# Patient Record
Sex: Female | Born: 1960 | Race: White | Hispanic: No | Marital: Married | Smoking: Never smoker
Health system: Southern US, Community
[De-identification: ages and names within clinical notes are randomized; demographics above are authoritative.]

## PROBLEM LIST (undated history)

## (undated) DIAGNOSIS — Z860100 Personal history of colon polyps, unspecified: Secondary | ICD-10-CM

## (undated) DIAGNOSIS — I10 Essential (primary) hypertension: Secondary | ICD-10-CM

## (undated) DIAGNOSIS — E559 Vitamin D deficiency, unspecified: Secondary | ICD-10-CM

## (undated) DIAGNOSIS — J020 Streptococcal pharyngitis: Secondary | ICD-10-CM

## (undated) DIAGNOSIS — I499 Cardiac arrhythmia, unspecified: Secondary | ICD-10-CM

## (undated) DIAGNOSIS — I5189 Other ill-defined heart diseases: Secondary | ICD-10-CM

## (undated) DIAGNOSIS — J309 Allergic rhinitis, unspecified: Secondary | ICD-10-CM

## (undated) DIAGNOSIS — Z85820 Personal history of malignant melanoma of skin: Secondary | ICD-10-CM

## (undated) DIAGNOSIS — K5790 Diverticulosis of intestine, part unspecified, without perforation or abscess without bleeding: Secondary | ICD-10-CM

## (undated) HISTORY — DX: Essential (primary) hypertension: I10

## (undated) HISTORY — DX: Personal history of colonic polyps: Z86.010

## (undated) HISTORY — DX: Diverticulosis of intestine, part unspecified, without perforation or abscess without bleeding: K57.90

## (undated) HISTORY — DX: Streptococcal pharyngitis: J02.0

## (undated) HISTORY — DX: Personal history of colon polyps, unspecified: Z86.0100

## (undated) HISTORY — DX: Vitamin D deficiency, unspecified: E55.9

## (undated) HISTORY — DX: Cardiac arrhythmia, unspecified: I49.9

## (undated) HISTORY — DX: Personal history of malignant melanoma of skin: Z85.820

## (undated) HISTORY — DX: Other ill-defined heart diseases: I51.89

## (undated) HISTORY — PX: ABLATION: SHX5711

## (undated) HISTORY — DX: Allergic rhinitis, unspecified: J30.9

---

## 1985-12-06 HISTORY — PX: MANDIBLE SURGERY: SHX707

## 2000-04-05 HISTORY — PX: DERMOID CYST  EXCISION: SHX1452

## 2000-04-22 ENCOUNTER — Encounter (INDEPENDENT_AMBULATORY_CARE_PROVIDER_SITE_OTHER): Payer: Self-pay

## 2000-04-22 ENCOUNTER — Inpatient Hospital Stay (HOSPITAL_COMMUNITY): Admission: EM | Admit: 2000-04-22 | Discharge: 2000-04-22 | Payer: Self-pay | Admitting: *Deleted

## 2000-04-22 ENCOUNTER — Encounter: Payer: Self-pay | Admitting: *Deleted

## 2002-01-31 ENCOUNTER — Encounter: Admission: RE | Admit: 2002-01-31 | Discharge: 2002-01-31 | Payer: Self-pay | Admitting: Unknown Physician Specialty

## 2002-01-31 ENCOUNTER — Encounter: Payer: Self-pay | Admitting: Unknown Physician Specialty

## 2002-10-01 ENCOUNTER — Encounter: Payer: Self-pay | Admitting: Unknown Physician Specialty

## 2002-10-01 ENCOUNTER — Encounter: Admission: RE | Admit: 2002-10-01 | Discharge: 2002-10-01 | Payer: Self-pay | Admitting: Unknown Physician Specialty

## 2003-10-08 ENCOUNTER — Encounter: Admission: RE | Admit: 2003-10-08 | Discharge: 2003-10-08 | Payer: Self-pay | Admitting: Unknown Physician Specialty

## 2004-10-21 ENCOUNTER — Encounter: Admission: RE | Admit: 2004-10-21 | Discharge: 2004-10-21 | Payer: Self-pay | Admitting: Unknown Physician Specialty

## 2006-01-05 ENCOUNTER — Encounter: Admission: RE | Admit: 2006-01-05 | Discharge: 2006-01-05 | Payer: Self-pay | Admitting: Unknown Physician Specialty

## 2007-01-06 ENCOUNTER — Encounter: Admission: RE | Admit: 2007-01-06 | Discharge: 2007-01-06 | Payer: Self-pay | Admitting: Unknown Physician Specialty

## 2008-01-08 ENCOUNTER — Encounter: Admission: RE | Admit: 2008-01-08 | Discharge: 2008-01-08 | Payer: Self-pay | Admitting: Unknown Physician Specialty

## 2008-02-04 DIAGNOSIS — J020 Streptococcal pharyngitis: Secondary | ICD-10-CM

## 2008-02-04 HISTORY — DX: Streptococcal pharyngitis: J02.0

## 2008-03-06 HISTORY — PX: NEVUS EXCISION: SHX2090

## 2009-02-17 ENCOUNTER — Encounter: Admission: RE | Admit: 2009-02-17 | Discharge: 2009-02-17 | Payer: Self-pay | Admitting: Internal Medicine

## 2010-03-05 ENCOUNTER — Encounter: Admission: RE | Admit: 2010-03-05 | Discharge: 2010-03-05 | Payer: Self-pay | Admitting: Unknown Physician Specialty

## 2010-12-27 ENCOUNTER — Encounter: Payer: Self-pay | Admitting: Internal Medicine

## 2011-02-22 ENCOUNTER — Other Ambulatory Visit: Payer: Self-pay | Admitting: Internal Medicine

## 2011-02-22 DIAGNOSIS — Z1231 Encounter for screening mammogram for malignant neoplasm of breast: Secondary | ICD-10-CM

## 2011-03-08 ENCOUNTER — Ambulatory Visit: Payer: Self-pay

## 2011-03-16 ENCOUNTER — Ambulatory Visit
Admission: RE | Admit: 2011-03-16 | Discharge: 2011-03-16 | Disposition: A | Discharge status: Home / Self Care | Payer: BC Managed Care – PPO | Source: Ambulatory Visit | Attending: Internal Medicine | Admitting: Internal Medicine

## 2011-03-16 DIAGNOSIS — Z1231 Encounter for screening mammogram for malignant neoplasm of breast: Secondary | ICD-10-CM

## 2012-02-10 ENCOUNTER — Other Ambulatory Visit: Payer: Self-pay | Admitting: Respiratory Therapy

## 2012-02-10 ENCOUNTER — Other Ambulatory Visit: Payer: Self-pay | Admitting: Internal Medicine

## 2012-02-10 DIAGNOSIS — Z1231 Encounter for screening mammogram for malignant neoplasm of breast: Secondary | ICD-10-CM

## 2012-03-21 ENCOUNTER — Ambulatory Visit
Admission: RE | Admit: 2012-03-21 | Discharge: 2012-03-21 | Disposition: A | Discharge status: Home / Self Care | Payer: BC Managed Care – PPO | Source: Ambulatory Visit | Attending: Internal Medicine | Admitting: Internal Medicine

## 2012-03-21 DIAGNOSIS — Z1231 Encounter for screening mammogram for malignant neoplasm of breast: Secondary | ICD-10-CM

## 2012-04-13 HISTORY — PX: DILATION AND CURETTAGE, DIAGNOSTIC / THERAPEUTIC: SUR384

## 2013-03-06 ENCOUNTER — Other Ambulatory Visit: Payer: Self-pay

## 2013-03-06 DIAGNOSIS — Z1231 Encounter for screening mammogram for malignant neoplasm of breast: Secondary | ICD-10-CM

## 2013-04-03 ENCOUNTER — Ambulatory Visit: Payer: BC Managed Care – PPO

## 2013-04-06 ENCOUNTER — Ambulatory Visit
Admission: RE | Admit: 2013-04-06 | Discharge: 2013-04-06 | Disposition: A | Discharge status: Home / Self Care | Payer: BC Managed Care – PPO | Source: Ambulatory Visit

## 2013-04-06 DIAGNOSIS — Z1231 Encounter for screening mammogram for malignant neoplasm of breast: Secondary | ICD-10-CM

## 2013-09-05 ENCOUNTER — Ambulatory Visit (INDEPENDENT_AMBULATORY_CARE_PROVIDER_SITE_OTHER): Payer: BC Managed Care – PPO | Admitting: Psychology

## 2013-09-05 DIAGNOSIS — F4320 Adjustment disorder, unspecified: Secondary | ICD-10-CM

## 2013-09-06 ENCOUNTER — Ambulatory Visit (INDEPENDENT_AMBULATORY_CARE_PROVIDER_SITE_OTHER): Payer: BC Managed Care – PPO | Admitting: Psychology

## 2013-09-06 DIAGNOSIS — F4320 Adjustment disorder, unspecified: Secondary | ICD-10-CM

## 2013-09-14 ENCOUNTER — Ambulatory Visit (INDEPENDENT_AMBULATORY_CARE_PROVIDER_SITE_OTHER): Payer: BC Managed Care – PPO | Admitting: Psychology

## 2013-09-14 DIAGNOSIS — F4320 Adjustment disorder, unspecified: Secondary | ICD-10-CM

## 2013-09-21 ENCOUNTER — Ambulatory Visit (INDEPENDENT_AMBULATORY_CARE_PROVIDER_SITE_OTHER): Payer: BC Managed Care – PPO | Admitting: Psychology

## 2013-09-21 DIAGNOSIS — F4320 Adjustment disorder, unspecified: Secondary | ICD-10-CM

## 2013-09-24 ENCOUNTER — Ambulatory Visit (INDEPENDENT_AMBULATORY_CARE_PROVIDER_SITE_OTHER): Payer: BC Managed Care – PPO | Admitting: Psychology

## 2013-09-24 ENCOUNTER — Ambulatory Visit: Payer: BC Managed Care – PPO | Admitting: Psychology

## 2013-09-24 DIAGNOSIS — F4320 Adjustment disorder, unspecified: Secondary | ICD-10-CM

## 2013-10-05 ENCOUNTER — Ambulatory Visit: Payer: BC Managed Care – PPO | Admitting: Psychology

## 2014-03-27 ENCOUNTER — Other Ambulatory Visit: Payer: Self-pay

## 2014-03-27 DIAGNOSIS — Z1231 Encounter for screening mammogram for malignant neoplasm of breast: Secondary | ICD-10-CM

## 2014-04-19 ENCOUNTER — Encounter (INDEPENDENT_AMBULATORY_CARE_PROVIDER_SITE_OTHER): Payer: Self-pay

## 2014-04-19 ENCOUNTER — Ambulatory Visit
Admission: RE | Admit: 2014-04-19 | Discharge: 2014-04-19 | Disposition: A | Discharge status: Home / Self Care | Payer: BC Managed Care – PPO | Source: Ambulatory Visit

## 2014-04-19 DIAGNOSIS — Z1231 Encounter for screening mammogram for malignant neoplasm of breast: Secondary | ICD-10-CM

## 2016-04-06 ENCOUNTER — Other Ambulatory Visit: Payer: Self-pay

## 2016-08-14 ENCOUNTER — Encounter (HOSPITAL_COMMUNITY): Payer: Self-pay

## 2016-08-14 ENCOUNTER — Emergency Department (HOSPITAL_COMMUNITY)
Admission: EM | Admit: 2016-08-14 | Discharge: 2016-08-14 | Disposition: A | Discharge status: Home / Self Care | Payer: BLUE CROSS/BLUE SHIELD | Attending: Emergency Medicine | Admitting: Emergency Medicine

## 2016-08-14 DIAGNOSIS — F0781 Postconcussional syndrome: Secondary | ICD-10-CM | POA: Insufficient documentation

## 2016-08-14 DIAGNOSIS — Y9389 Activity, other specified: Secondary | ICD-10-CM | POA: Insufficient documentation

## 2016-08-14 DIAGNOSIS — S0990XA Unspecified injury of head, initial encounter: Secondary | ICD-10-CM | POA: Diagnosis present

## 2016-08-14 DIAGNOSIS — Z791 Long term (current) use of non-steroidal anti-inflammatories (NSAID): Secondary | ICD-10-CM | POA: Diagnosis not present

## 2016-08-14 DIAGNOSIS — R51 Headache: Secondary | ICD-10-CM | POA: Insufficient documentation

## 2016-08-14 DIAGNOSIS — Y999 Unspecified external cause status: Secondary | ICD-10-CM | POA: Diagnosis not present

## 2016-08-14 DIAGNOSIS — Z23 Encounter for immunization: Secondary | ICD-10-CM | POA: Diagnosis not present

## 2016-08-14 DIAGNOSIS — Y929 Unspecified place or not applicable: Secondary | ICD-10-CM | POA: Insufficient documentation

## 2016-08-14 MED ORDER — LIDOCAINE-EPINEPHRINE-TETRACAINE (LET) SOLUTION
3.0000 mL | Freq: Once | NASAL | Status: AC
  Administered 2016-08-14: 3 mL via TOPICAL
  Filled 2016-08-14: qty 3

## 2016-08-14 MED ORDER — ONDANSETRON HCL 4 MG/2ML IJ SOLN
4.0000 mg | Freq: Once | INTRAMUSCULAR | Status: AC
  Administered 2016-08-14: 4 mg via INTRAVENOUS
  Filled 2016-08-14: qty 2

## 2016-08-14 MED ORDER — METOCLOPRAMIDE HCL 5 MG/ML IJ SOLN
10.0000 mg | Freq: Once | INTRAMUSCULAR | Status: AC
  Administered 2016-08-14: 10 mg via INTRAVENOUS
  Filled 2016-08-14: qty 2

## 2016-08-14 MED ORDER — ACETAMINOPHEN 325 MG PO TABS
650.0000 mg | ORAL_TABLET | Freq: Once | ORAL | Status: AC
  Administered 2016-08-14: 650 mg via ORAL
  Filled 2016-08-14: qty 2

## 2016-08-14 MED ORDER — DIPHENHYDRAMINE HCL 50 MG/ML IJ SOLN
25.0000 mg | Freq: Once | INTRAMUSCULAR | Status: AC
  Administered 2016-08-14: 25 mg via INTRAVENOUS
  Filled 2016-08-14: qty 1

## 2016-08-14 MED ORDER — SODIUM CHLORIDE 0.9 % IV BOLUS (SEPSIS)
1000.0000 mL | Freq: Once | INTRAVENOUS | Status: AC
  Administered 2016-08-14: 1000 mL via INTRAVENOUS

## 2016-08-14 MED ORDER — TETANUS-DIPHTH-ACELL PERTUSSIS 5-2.5-18.5 LF-MCG/0.5 IM SUSP
0.5000 mL | Freq: Once | INTRAMUSCULAR | Status: AC
  Administered 2016-08-14: 0.5 mL via INTRAMUSCULAR
  Filled 2016-08-14: qty 0.5

## 2016-08-14 NOTE — ED Notes (Signed)
Previous nurse verbalized LET given to pt prior to IV start per pt request.
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
## 2016-08-14 NOTE — ED Notes (Addendum)
Upon rounding on pt, pt states "this is ridiculous. I have been here since eight thirty this morning. I am ready to go." Pt begins to pull own IV out verbalizes "just let me go." At this time, physician assistant walks in and states will print discharge paperwork.
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
## 2016-08-14 NOTE — ED Notes (Signed)
At time of discharge pt refused VS. Pt made aware of risks leaving with VS obtained. Pt continued to refuse and leave.

## 2016-08-14 NOTE — ED Notes (Signed)
Pt request zofran post administration of medications. See MAR.

## 2016-08-14 NOTE — Discharge Instructions (Signed)
Substance Abuse Treatment Programs ° °Intensive Outpatient Programs °[facility]     °[address]      °[place], [place]                   °[phone]      ° °[facility] °[address] °[place], [place] °[phone] ° °[facility]     °(Inpatient and outpatient)     °[address]           °[phone]   ° °[facility] °[phone] (Suboxone and Methadone) ° °[address]      °[place], [place] [address]      °[phone]      ° °[address] °[place], [place] °[phone] ° °[facility] (Outpatient/Inpatient, Chemical)    °(insurance only) [phone]      °       °[facility] (Groups & Residential) °[place], [place] °[phone] ° °   °[facility]     °[address]     °[place], [place]      °[phone]      ° °[facility] (for caregivers and family) °[address] °[place], [place] °336-299-4655 ° ° ° ° ° °Residential Treatment Programs °[facility]      °[address], [place], [place] [address]  °[phone]      ° °[facility] °[address] [place], [place] [address] °[phone] ° °[facility]        °[phone]      ° °[facility] °1-800-659-3381 ° °[facility]             °[address]                                         °[place], [place]                                                °877-615-2722 or 336-784-9470                              ° °[facility] [place] °[address] °[place], [address] °[phone] ° °[facility]    °[address]      °[place], [place]     °[phone]      ° °[facility] °[address] °[place], [place] °336-285-9073 ° °[facility]   °[address]     °[place], [place] [address]     °[phone]      °Admissions: 8am-3pm M-F ° °[facility] °[address] °[place],  [place] °336-227-7417 ° °[facility]: Residential Program (90 Days)   °[place], [place]      °336-725-8389 or 800-758-6077    ° °[facility] °[place],  °(Walk in Hours over the weekend or by referral) ° °[facility] °[address], [place],  [address] °([phone] ° °Crisis Mobile: [facility]:  1-877-626-1772 (for crisis response 24 hours a day) °[facility] Hotline:      1-800-256-2452 °Outpatient Psychiatry and Counseling ° °[facility]: Mobile Crisis   Management 24 hours:  1-877-626-1772 ° °[facility] sliding scale fee and walk in schedule: M-F 8am-12pm/1pm-3pm °[address]  °[place], [place] [address] °[phone] ° °[facility] °[address] °[place], [place] [address] °336-703-9650 ° °[facility] ([facility])- new patient walk-in appointments available Monday - Friday 8am -3pm.          °[address] °[place], [place] [address] °336-676-6840 or crisis line- 336-676-6905 ° °[facility] Intensive Outpatient Therapy Program °[address] °[place], [place] [address] °336-832-9804 ° °[facility]                  °Crisis Services      °[phone]      °[address]     °[place], [place] [address]                ° °[facility]   °[facility] °[phone] °[address] °[place], [place] [address] ° ° °[facility]          °[address]  °[place], [place] [address]       °[phone] ° °[facility] °[address] °[place], [place] [address] °336-292-1510 ° °[facility]    °[address]    °[place], [place] [address]     °[phone]      ° °Parish McKinney, MD     °[address]     °[place] [address]     °336-282-1251     °  °[facility] °[address] °[place] [address] ° °[facility]     °[address]     °[place], [place]      °336-542-2076      ° °[facility] °Shamsher Ahluwalia, MD °[address] °[place], [place] [address] °[phone] ° °[facility]     °[address]     °[place], [place] [address]     °336-274-1237      ° °[facility] °[address] °[place], [place] [address] °336-854-4450 °Resources for Temporary Residential Assistance/Crisis Centers ° °DAY CENTERS °[facility] °M-F 8am-3pm   °[address] [place], [place] [address]   336-332-0824 °Services include: laundry, barbering, support groups, case management, phone  & computer access, showers, AA/NA mtgs, mental health/substance abuse nurse, job skills class, disability information, VA assistance, spiritual classes, etc.  ° °HOMELESS SHELTERS ° °[facility]     °[facility]   °[address], [place]     °[phone]       °       °[facility] (women and children)       °[address] °[place], [place] [address] °336-275-0820 °[email or web address] for application and process °Application Required ° °[facility]   °[address]    °[place] [address]     °[phone]       °             °[facility] °[address] °[place], [place] [address] °336.273.5572 °336-235-0363(schedule application appt.) °Application Required ° °[facility] (women only)    °[address]     °[place],  [address]     °336-884-1039      °  Intake starts 6pm daily °Need valid ID, SSC, & Police report °[facility] [place] °[address] °[place], [place] °336-881-5420 °Application Required ° °[facility] (men only)     °[address]      °[place], [place]     °[phone]      ° °[facility] °(Pregnant women only) °[address] °[place], [place] °336-275-0206 ° °[facility]      °[address]      °[place], [place] [address]     °[phone]      °       °[facility] °[address] °[place], [place] °336-723-1848 °90 day commitment/SA/Application process ° °[facility](men only)     °[address]     °[place], [place]     °[phone]       °Check-in at 7pm     °       °[facility] °[address] °[place], [place] [address] °336-248-6684 °Men/Women/Women and Children must be there by 7 pm ° °[facility] °[place], [place] °336-722-8721                ° °

## 2016-10-05 ENCOUNTER — Telehealth: Payer: Self-pay | Admitting: Internal Medicine

## 2016-10-05 DIAGNOSIS — R079 Chest pain, unspecified: Secondary | ICD-10-CM

## 2016-10-05 DIAGNOSIS — I493 Ventricular premature depolarization: Secondary | ICD-10-CM

## 2016-10-05 DIAGNOSIS — I10 Essential (primary) hypertension: Secondary | ICD-10-CM

## 2016-10-05 NOTE — Addendum Note (Signed)
Addended by: Julio SicksBOWERS, JENNIFER L on: 10/05/2016 04:42 PM   Modules accepted: Orders

## 2016-10-05 NOTE — Telephone Encounter (Signed)
Pt left text earlier in day  BP was high at 179/101 Complained of CP and jaw pain  [facility]  Blood work done  Trop negative per her report.  BP caome down some though ramined high for her 140s/70s  PT says that she had some ventricular bigeminy and then PVCs on monitor.  She was [facility] with recomm to f/u with primary MD The pt has been under tremendous stress due to family problems   She went walking on Sunday a couple miles without problem She wroks as an anesthesiologist.    I called pt last night when at home  Feeling better   I examined and reviewed d/c note form [facility]  Took pt BP which was 120s/70s  P 70 with occasional skip.  Pt appeared comfortable but anxious   No CP  Lungs CTA  Card RRR  No murmurs    I recomm low dose b blocker  (metoprolol 12.5 bid) given increased BP trends and PVCs. Called [facility] to [facility] [address] Activity as tolerated  Based on response, consider further testing     PT  called back today  Feeling good  BP 116/  Sat 99 Still with Freq skips  Trigeminy at times  on monitor at work  [facility]  y[facility]  Trop negative  Sent home with recommendation  for outpt f/u for HTN in 1 week   CP feltt atypical     Plan:  Keep on low dose b blocker for now.   Activity as tolerated  Follow BP   WIll arrange for outpt echo.  Contact pt back for scheduling    Dietrich PatesPaula Larua Collier

## 2016-10-05 NOTE — Telephone Encounter (Signed)
Placed order for echo.  Spoke with Martie LeeSabrina at the [place] office and scheduled pt's echo for 10/11/16 at 4pm.  Tried to contact pt, phone rang out several times with no answer and no VM option.  Will try again later.

## 2016-10-06 ENCOUNTER — Other Ambulatory Visit: Payer: Self-pay | Admitting: *Deleted

## 2016-10-06 DIAGNOSIS — R002 Palpitations: Secondary | ICD-10-CM

## 2016-10-06 NOTE — Progress Notes (Signed)
Call from Dr. Tenny Crawoss who requests patient have 24 hr hol[facility] and [facility] for [facility].  Pt scheduled for echo on Monday there but per Sabrina in [place] scheduling unable to add holter and ekg at that time.   If patient is available 10/07/16 in the morning these can be done at that time.  Orders have been placed. When confirmed that patient is agreeable to tomorrow, I will call Sabrina back and schedule.  Spoke with scheduler in [place], pt can come at 7:30 am tomorrow for EKG, then holter.  Informed Dr. Tenny Crawoss who will inform the patient.

## 2016-10-06 NOTE — Telephone Encounter (Signed)
Left message to call back  

## 2016-10-07 NOTE — Progress Notes (Signed)
Patient missed pick up of monitor this AM  Had work change Will try to arrange alternate time  Can monitor be placed on day I am in office?

## 2016-10-07 NOTE — Progress Notes (Signed)
Patient to have echo at [facility] on Monday afternoon.

## 2016-10-11 ENCOUNTER — Ambulatory Visit (INDEPENDENT_AMBULATORY_CARE_PROVIDER_SITE_OTHER): Payer: BLUE CROSS/BLUE SHIELD

## 2016-10-11 ENCOUNTER — Other Ambulatory Visit: Payer: Self-pay

## 2016-10-11 DIAGNOSIS — I493 Ventricular premature depolarization: Secondary | ICD-10-CM | POA: Diagnosis not present

## 2016-10-11 DIAGNOSIS — I10 Essential (primary) hypertension: Secondary | ICD-10-CM

## 2016-10-11 DIAGNOSIS — R079 Chest pain, unspecified: Secondary | ICD-10-CM | POA: Diagnosis not present

## 2016-10-11 NOTE — Telephone Encounter (Signed)
Pt here in our office for ECHO. Pt states that she was told that she was to get a stress ECHO; she has been NPO in preparation. Pt is upset and is attempting to get in touch w/ "Dr. Ladona Ridgelaylor - I know Dr. Tenny Crawoss by her married name." Pt is in the waiting room attempting to text Dr. Tenny Crawoss. Pt was advised by ECHO tech that even if new order is placed, we do not have staff to do a stress ECHO this pm.  I have not spoken w/ pt, but ECHO tech states that pt is quite frustrated due to the confusion.

## 2016-10-18 ENCOUNTER — Ambulatory Visit (INDEPENDENT_AMBULATORY_CARE_PROVIDER_SITE_OTHER): Payer: BLUE CROSS/BLUE SHIELD

## 2016-10-18 DIAGNOSIS — R002 Palpitations: Secondary | ICD-10-CM | POA: Diagnosis not present

## 2016-12-04 ENCOUNTER — Other Ambulatory Visit: Payer: Self-pay | Admitting: Internal Medicine

## 2016-12-07 NOTE — Telephone Encounter (Signed)
Please advise on refill request. Requested medication is not listed on snapshot, also I do not see where patient has ever had an office visit here. Thanks, MI

## 2016-12-09 NOTE — Telephone Encounter (Signed)
Katie RifflePaula V Ross, MD      1:47 PM  Note    Pt left text earlier in day  BP was high at 179/101 Complained of CP and jaw pain  [facility]  Blood work done  Trop negative per her report.  BP caome down some though ramined high for her 140s/70s  PT says that she had some ventricular bigeminy and then PVCs on monitor.  [facility] with recomm to f/u with primary MD The pt has been under tremendous stress due to family problems   She went walking on Sunday a couple miles without problem She wroks as an anesthesiologist.    I called pt last night when at home  Feeling better   I examined and reviewed d/c note form [facility]  Took pt BP which was 120s/70s  P 70 with occasional skip.  Pt appeared comfortable but anxious   No CP  Lungs CTA  Card RRR  No murmurs    I recomm low dose b blocker  (metoprolol 12.5 bid) given increased BP trends and PVCs. [facility] [address] Activity as tolerated  Based on response, consider further testing     PT  called back today  Feeling good  BP 116/  Sat 99 Still with Freq skips  Trigeminy at times  on monitor at work  [facility]  Trop negative  Sent home with recommendation  for outpt f/u for HTN in 1 week   CP feltt atypical     Plan:  Keep on low dose b blocker for now.   Activity as tolerated  Follow BP   WIll arrange for outpt echo.  Contact pt back for scheduling        Copied note from encounter dated 10/05/16 Refilled metoprolol 12.5 mg BID

## 2017-12-05 ENCOUNTER — Encounter: Payer: Self-pay | Admitting: *Deleted

## 2018-01-02 ENCOUNTER — Ambulatory Visit: Payer: BLUE CROSS/BLUE SHIELD | Admitting: Internal Medicine

## 2018-01-16 ENCOUNTER — Ambulatory Visit: Payer: Self-pay | Admitting: Internal Medicine

## 2018-01-27 ENCOUNTER — Ambulatory Visit: Payer: Self-pay | Admitting: Internal Medicine

## 2018-01-30 ENCOUNTER — Encounter: Payer: Self-pay | Admitting: Internal Medicine

## 2018-01-30 ENCOUNTER — Ambulatory Visit: Payer: Managed Care, Other (non HMO) | Admitting: Internal Medicine

## 2018-01-30 VITALS — BP 114/80 | HR 59 | Ht 67.0 in | Wt 126.2 lb

## 2018-01-30 DIAGNOSIS — R059 Cough, unspecified: Secondary | ICD-10-CM

## 2018-01-30 DIAGNOSIS — R002 Palpitations: Secondary | ICD-10-CM

## 2018-01-30 DIAGNOSIS — I10 Essential (primary) hypertension: Secondary | ICD-10-CM | POA: Diagnosis not present

## 2018-01-30 DIAGNOSIS — R05 Cough: Secondary | ICD-10-CM

## 2018-01-30 LAB — LIPID PANEL
Chol/HDL Ratio: 2.3 ratio (ref 0.0–4.4)
Cholesterol, Total: 180 mg/dL (ref 100–199)
HDL: 78 mg/dL (ref >39)
LDL Calculated: 91 mg/dL (ref 0–99)
TRIGLYCERIDES: 57 mg/dL (ref 0–149)
VLDL CHOLESTEROL CAL: 11 mg/dL (ref 5–40)

## 2018-01-30 LAB — CBC
HEMATOCRIT: 37.1 % (ref 34.0–46.6)
Hemoglobin: 12.6 g/dL (ref 11.1–15.9)
MCH: 31.9 pg (ref 26.6–33.0)
MCHC: 34 g/dL (ref 31.5–35.7)
MCV: 94 fL (ref 79–97)
PLATELETS: 227 10*3/uL (ref 150–379)
RBC: 3.95 x10E6/uL (ref 3.77–5.28)
RDW: 13.9 % (ref 12.3–15.4)
WBC: 4.5 10*3/uL (ref 3.4–10.8)

## 2018-01-30 NOTE — Patient Instructions (Signed)
Your physician recommends that you continue on your current medications as directed. Please refer to the Current Medication list given to you today. Your physician recommends that you return for lab work in: TODAY (CBC, LIPIDS) A chest x-ray takes a picture of the organs and structures inside the chest, including the heart, lungs, and blood vessels. This test can show several things, including, whether the heart is enlarges; whether fluid is building up in the lungs; and whether pacemaker / defibrillator leads are still in place. Your physician wants you to follow-up in: 1 YEAR WITH DR. Tenny CrawOSS.  You will receive a reminder letter in the mail two months in advance. If you don't receive a letter, please call our office to schedule the follow-up appointment.

## 2018-01-30 NOTE — Progress Notes (Signed)
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 acetaminophen (TYLENOL) 325 MG tablet Take 650 mg by mouth daily as needed for moderate pain.  Marland Kitchen. ibuprofen (ADVIL,MOTRIN) 200 MG tablet Take 600 mg by mouth daily as needed for moderate pain.  . metoprolol tartrate (LOPRESSOR) 25 MG tablet Take 0.5 tablets (12.5 mg total) by mouth 2 (two) times daily.     Allergies:   Penicillins; Amoxicillin; Chocolate; and Other   Past Medical History:  Diagnosis Date  . Diastolic dysfunction without heart failure   . Diverticulosis   . High blood pressure   . History of malignant melanoma   . Irregular heartbeat   . Personal history of colonic polyps   . Rhinitis, allergic   . Strep throat 02/2008  . Vitamin D deficiency     Past Surgical History:  Procedure Laterality Date  . ABLATION    . DERMOID CYST  EXCISION Right 04/2000   right ovarian cyst  . DILATION AND CURETTAGE, DIAGNOSTIC / THERAPEUTIC  04/13/2012  . MANDIBLE SURGERY  12/1985   [facility], [place]  . NEVUS EXCISION  03/2008     Social History:  The patient  reports that  has never smoked. she has never used smokeless tobacco. She reports that she drinks alcohol. She reports that she does not use drugs.   Family History:  The patient's family history includes Breast cancer in her mother; Colon polyps in her mother;  Diabetes in her father; Healthy in her daughter; Hypertension in her father and mother; Melanoma in her brother; Obesity in her mother.    ROS:  Please see the history of present illness. All other systems are reviewed and  Negative to the above problem except as noted.    PHYSICAL EXAM: VS:  BP 114/80   Pulse (!) 59   Ht 5\' 7"  (1.702 m)   Wt 126 lb 3.2 oz (57.2 kg)   BMI 19.77 kg/m   GEN: Well nourished, well developed, in no acute distress  HEENT: normal  Neck: no JVD, carotid bruits, or masses Cardiac: RRR; no murmurs, rubs, or gallops,no edema  Respiratory:  Very mild upper airway wheeze  Rhonchi in L Lung    GI: soft, nontender, nondistended, + BS  No hepatomegaly  MS: no deformity Moving all extremities   Skin: warm and dry, no rash Neuro:  Strength and sensation are intact Psych: euthymic mood, full affect   EKG:  EKG is ordered today.  SB 59 bpm     Lipid Panel No results found for: CHOL, TRIG, HDL, CHOLHDL, VLDL, LDLCALC, LDLDIRECT    Wt Readings from Last 3 Encounters:  01/30/18 126 lb 3.2 oz (  57.2 kg)      ASSESSMENT AND PLAN:  1  HTN  Good control  Continue    2  Hx palpitations   Pt denies   Doing good  Keep on metoprolo  3  Pulm  Will set up for CXR   Encouraged use of Mucinex    4  HCM  Check CBC and lipids   Tentative f/u in 1 year     Current medicines are reviewed at length with the patient today.  The patient does not have concerns regarding medicines.  Signed, Dietrich Pates, MD  01/30/2018 10:41 AM    [facility] [address] [place], [place], [place]  [address] Phone: [phone]; Fax: [phone]

## 2018-01-31 ENCOUNTER — Ambulatory Visit
Admission: RE | Admit: 2018-01-31 | Discharge: 2018-01-31 | Disposition: A | Discharge status: Home / Self Care | Payer: Managed Care, Other (non HMO) | Source: Ambulatory Visit | Attending: Internal Medicine | Admitting: Internal Medicine

## 2018-01-31 DIAGNOSIS — R05 Cough: Secondary | ICD-10-CM

## 2018-01-31 DIAGNOSIS — R059 Cough, unspecified: Secondary | ICD-10-CM

## 2019-04-27 ENCOUNTER — Telehealth: Payer: Self-pay | Admitting: Internal Medicine

## 2019-04-27 NOTE — Telephone Encounter (Signed)
Virtual Visit Pre-Appointment Phone Call  "(Name), I am calling you today to discuss your upcoming appointment. We are currently trying to limit exposure to the virus that causes COVID-19 by seeing patients at home rather than in the office."  1. "What is the BEST phone number to call the day of the visit?" - include this in appointment notes  2. Do you have or have access to (through a family member/friend) a smartphone with video capability that we can use for your visit?" a. If yes - list this number in appt notes as cell (if different from BEST phone #) and list the appointment type as a VIDEO visit in appointment notes b. If no - list the appointment type as a PHONE visit in appointment notes  3. Confirm consent - "In the setting of the current Covid19 crisis, you are scheduled for a (phone or video) visit with your provider on (date) at (time).  Just as we do with many in-office visits, in order for you to participate in this visit, we must obtain consent.  If you'd like, I can send this to your mychart (if signed up) or email for you to review.  Otherwise, I can obtain your verbal consent now.  All virtual visits are billed to your insurance company just like a normal visit would be.  By agreeing to a virtual visit, we'd like you to understand that the technology does not allow for your provider to perform an examination, and thus may limit your provider's ability to fully assess your condition. If your provider identifies any concerns that need to be evaluated in person, we will make arrangements to do so.  Finally, though the technology is pretty good, we cannot assure that it will always work on either your or our end, and in the setting of a video visit, we may have to convert it to a phone-only visit.  In either situation, we cannot ensure that we have a secure connection.  Are you willing to proceed?" YES  4. Advise patient to be prepared - "Two hours prior to your appointment, go  ahead and check your blood pressure, pulse, oxygen saturation, and your weight (if you have the equipment to check those) and write them all down. When your visit starts, your provider will ask you for this information. If you have an Apple Watch or Kardia device, please plan to have heart rate information ready on the day of your appointment. Please have a pen and paper handy nearby the day of the visit as well."  5. Give patient instructions for MyChart download to smartphone OR Doximity/Doxy.me as below if video visit (depending on what platform provider is using)  6. Inform patient they will receive a phone call 15 minutes prior to their appointment time (may be from unknown caller ID) so they should be prepared to answer    TELEPHONE CALL NOTE  ANNETRA BEAGLEY has been deemed a candidate for a follow-up tele-health visit to limit community exposure during the Covid-19 pandemic. I spoke with the patient via phone to ensure availability of phone/video source, confirm preferred email & phone number, and discuss instructions and expectations.  I reminded Katie Joseph to be prepared with any vital sign and/or heart rhythm information that could potentially be obtained via home monitoring, at the time of her visit. I reminded NINO KIRSCHNER to expect a phone call prior to her visit.  Rayburn Ma Johnson 04/27/2019 1:25 PM   INSTRUCTIONS FOR DOWNLOADING  THE MYCHART APP TO SMARTPHONE  - The patient must first make sure to have activated MyChart and know their login information - If Apple, go to [employer] and type in MyChart in the search bar and download the app. If Android, ask patient to go to [employer] and type in [place] in the search bar and download the app. The app is free but as with any other app downloads, their phone may require them to verify saved payment information or Apple/Android password.  - The patient will need to then log into the app with their MyChart username and  password, and select [facility] as their healthcare provider to link the account. When it is time for your visit, go to the MyChart app, find appointments, and click Begin Video Visit. Be sure to Select Allow for your device to access the Microphone and Camera for your visit. You will then be connected, and your provider will be with you shortly.  **If they have any issues connecting, or need assistance please contact MyChart service desk [phone])**  **If using a computer, in order to ensure the best quality for their visit they will need to use either of the following Internet Browsers: [employer], or Google Chrome**  IF USING DOXIMITY or DOXY.ME - The patient will receive a link just prior to their visit by text.     FULL LENGTH CONSENT FOR TELE-HEALTH VISIT   I hereby voluntarily request, consent and authorize [facility] and its employed or contracted physicians, physician assistants, nurse practitioners or other licensed health care professionals (the Practitioner), to provide me with telemedicine health care services (the Services") as deemed necessary by the treating Practitioner. I acknowledge and consent to receive the Services by the Practitioner via telemedicine. I understand that the telemedicine visit will involve communicating with the Practitioner through live audiovisual communication technology and the disclosure of certain medical information by electronic transmission. I acknowledge that I have been given the opportunity to request an in-person assessment or other available alternative prior to the telemedicine visit and am voluntarily participating in the telemedicine visit.  I understand that I have the right to withhold or withdraw my consent to the use of telemedicine in the course of my care at any time, without affecting my right to future care or treatment, and that the Practitioner or I may terminate the telemedicine visit at any time. I  understand that I have the right to inspect all information obtained and/or recorded in the course of the telemedicine visit and may receive copies of available information for a reasonable fee.  I understand that some of the potential risks of receiving the Services via telemedicine include:   Delay or interruption in medical evaluation due to technological equipment failure or disruption;  Information transmitted may not be sufficient (e.g. poor resolution of images) to allow for appropriate medical decision making by the Practitioner; and/or   In rare instances, security protocols could fail, causing a breach of personal health information.  Furthermore, I acknowledge that it is my responsibility to provide information about my medical history, conditions and care that is complete and accurate to the best of my ability. I acknowledge that Practitioner's advice, recommendations, and/or decision may be based on factors not within their control, such as incomplete or inaccurate data provided by me or distortions of diagnostic images or specimens that may result from electronic transmissions. I understand that the practice of medicine is not an exact science and that Practitioner  makes no warranties or guarantees regarding treatment outcomes. I acknowledge that I will receive a copy of this consent concurrently upon execution via email to the email address I last provided but may also request a printed copy by calling the office of [facility].    I understand that my insurance will be billed for this visit.   I have read or had this consent read to me.  I understand the contents of this consent, which adequately explains the benefits and risks of the Services being provided via telemedicine.   I have been provided ample opportunity to ask questions regarding this consent and the Services and have had my questions answered to my satisfaction.  I give my informed consent for the services to be  provided through the use of telemedicine in my medical care  By participating in this telemedicine visit I agree to the above.

## 2019-05-06 NOTE — Progress Notes (Signed)
Virtual Visit via Video Note   This visit type was conducted due to national recommendations for restrictions regarding the COVID-19 Pandemic (e.g. social distancing) in an effort to limit this patient's exposure and mitigate transmission in our community.  Due to her co-morbid illnesses, this patient is at least at moderate risk for complications without adequate follow up.  This format is felt to be most appropriate for this patient at this time.  All issues noted in this document were discussed and addressed.  A limited physical exam was performed with this format.  Please refer to the patient's chart for her consent to telehealth for [facility]   Date:  05/07/2019   ID:  Katie Joseph, DOB October 23, 1961, MRN [id number]  Patient Location: Home Provider Location: Home  PCP:  Marden Noble, MD  Cardiologist:  Tenny Craw  Evaluation Performed:  Follow-Up Visit  Chief Complaint:  F/U of HTN and palpitaitons   History of Present Illness:    Katie Joseph is a 58 y.o. female with Hx of palpitations and HTN   I saw her in Feb 2019   Holter showed no signif arrhythmia, occasional PVCs   Treated with metoprolol  Since seen she has done well   She denies palpitations   Breathing is good   No CP    Has not taken BP recently   Very active working   [employer] at least 2 miles per day   The patient does not have symptoms concerning for COVID-19 infection (fever, chills, cough, or new shortness of breath).    Past Medical History:  Diagnosis Date  . Diastolic dysfunction without heart failure   . Diverticulosis   . High blood pressure   . History of malignant melanoma   . Irregular heartbeat   . Personal history of colonic polyps   . Rhinitis, allergic   . Strep throat 02/2008  . Vitamin D deficiency    Past Surgical History:  Procedure Laterality Date  . ABLATION    . DERMOID CYST  EXCISION Right 04/2000   right ovarian cyst  . DILATION AND CURETTAGE, DIAGNOSTIC / THERAPEUTIC  04/13/2012   . MANDIBLE SURGERY  12/1985   [facility], [place], [place]  . NEVUS EXCISION  03/2008     No outpatient medications have been marked as taking for the 05/07/19 encounter (Appointment) with Pricilla Riffle, MD.     Allergies:   Penicillins; Amoxicillin; Chocolate; and Other   Social History   Tobacco Use  . Smoking status: Never Smoker  . Smokeless tobacco: Never Used  Substance Use Topics  . Alcohol use: Yes    Comment: social  . Drug use: No     Family Hx: The patient's family history includes Breast cancer in her mother; Colon polyps in her mother; Diabetes in her father; Healthy in her daughter; Hypertension in her father and mother; Melanoma in her brother; Obesity in her mother. There is no history of Colon cancer or Liver cancer.  ROS:   Please see the history of present illness.     All other systems reviewed and are negative.   Prior CV studies:   The following studies were reviewed today:    Labs/Other Tests and Data Reviewed:    EKG:  No ECG reviewed.  Recent Labs: No results found for requested labs within last 8760 hours.   Recent Lipid Panel Lab Results  Component Value Date/Time   CHOL 180 01/30/2018 11:11 AM   TRIG 57 01/30/2018 11:11  AM   HDL 78 01/30/2018 11:11 AM   CHOLHDL 2.3 01/30/2018 11:11 AM   LDLCALC 91 01/30/2018 11:11 AM    Wt Readings from Last 3 Encounters:  01/30/18 126 lb 3.2 oz (57.2 kg)     Objective:    Vital Signs:  There were no vitals taken for this visit.   VITAL SIGNS:  reviewed  ASSESSMENT & PLAN:    1. Palpitations   Pt doing well  No palpitations     2   HTN   Asked her to track BP   She will keep on same meds   [facility]eds [facility]ills [facility]    COVID-19 Education: The signs and symptoms of COVID-19 were discussed with the patient and how to seek care for testing (follow up with PCP or arrange E-visit).  The importance of social distancing was discussed today.  Time:   Today, I have spent 15  minutes  with the patient with telehealth technology discussing the above problems.     Medication Adjustments/Labs and Tests Ordered: Current medicines are reviewed at length with the patient today.  Concerns regarding medicines are outlined above.   Tests Ordered: No orders of the defined types were placed in this encounter.   Medication Changes: No orders of the defined types were placed in this encounter.   Disposition:  Follow up next March 2021 Signed, Dietrich PatesPaula Delecia Vastine, MD  05/07/2019 9:03 AM    [facility]

## 2019-05-07 ENCOUNTER — Other Ambulatory Visit: Payer: Self-pay

## 2019-05-07 ENCOUNTER — Telehealth (INDEPENDENT_AMBULATORY_CARE_PROVIDER_SITE_OTHER): Payer: Managed Care, Other (non HMO) | Admitting: Internal Medicine

## 2019-05-07 DIAGNOSIS — I10 Essential (primary) hypertension: Secondary | ICD-10-CM | POA: Diagnosis not present

## 2019-05-07 DIAGNOSIS — R002 Palpitations: Secondary | ICD-10-CM | POA: Diagnosis not present

## 2019-05-07 MED ORDER — METOPROLOL TARTRATE 25 MG PO TABS: 12.5000 mg | ORAL_TABLET | Freq: Two times a day (BID) | ORAL | 3 refills | Status: DC

## 2019-05-07 NOTE — Patient Instructions (Addendum)
Medication Instructions:  No changes If you need a refill on your cardiac medications before your next appointment, please call your pharmacy.   Lab work: none If you have labs (blood work) drawn today and your tests are completely normal, you will receive your results only by: Marland Kitchen MyChart Message (if you have MyChart) OR . A paper copy in the mail If you have any lab test that is abnormal or we need to change your treatment, we will call you to review the results.  Testing/Procedures: none  Follow-Up: At [facility], you and your health needs are our priority.  As part of our continuing mission to provide you with exceptional heart care, we have created designated Provider Care Teams.  These Care Teams include your primary Cardiologist (physician) and Advanced Practice Providers (APPs -  Physician Assistants and Nurse Practitioners) who all work together to provide you with the care you need, when you need it. You will need a follow up appointment in:  March, 2021 --9 months.  Please call our office 2 months in advance to schedule this appointment.  You may see Dr. Tenny Craw or one of the following Advanced Practice Providers on your designated Care Team: Tereso Newcomer, PA-C [place], [place] . Berton Bon, NP  Any Other Special Instructions Will Be Listed Below (If Applicable).

## 2019-07-09 ENCOUNTER — Telehealth: Payer: Self-pay | Admitting: Internal Medicine

## 2019-07-09 MED ORDER — METOPROLOL TARTRATE 25 MG PO TABS: ORAL_TABLET | ORAL | 3 refills | Status: DC

## 2019-07-09 NOTE — Addendum Note (Signed)
Addended by: Stephani Police on: 07/09/2019 08:37 AM   Modules accepted: Orders

## 2019-07-09 NOTE — Telephone Encounter (Signed)
RX sent for Metoprolol per Dr. Harrington Challenger orders.

## 2019-07-09 NOTE — Telephone Encounter (Signed)
Patient needs refills om metoprolol tartrate   Fill as 25 mg tid as she sometimes has to take extra for palpitations   Fill 3 months  [facility]

## 2020-01-28 DIAGNOSIS — Z021 Encounter for pre-employment examination: Secondary | ICD-10-CM | POA: Diagnosis not present

## 2020-01-28 DIAGNOSIS — I519 Heart disease, unspecified: Secondary | ICD-10-CM | POA: Diagnosis not present

## 2020-01-28 DIAGNOSIS — I499 Cardiac arrhythmia, unspecified: Secondary | ICD-10-CM | POA: Diagnosis not present

## 2020-01-28 DIAGNOSIS — Z0184 Encounter for antibody response examination: Secondary | ICD-10-CM | POA: Diagnosis not present

## 2020-01-28 DIAGNOSIS — I10 Essential (primary) hypertension: Secondary | ICD-10-CM | POA: Diagnosis not present

## 2020-01-30 DIAGNOSIS — Z23 Encounter for immunization: Secondary | ICD-10-CM | POA: Diagnosis not present

## 2020-03-28 ENCOUNTER — Telehealth: Payer: Self-pay | Admitting: Internal Medicine

## 2020-03-28 MED ORDER — METOPROLOL TARTRATE 25 MG PO TABS: ORAL_TABLET | ORAL | 3 refills | Status: DC

## 2020-03-28 NOTE — Addendum Note (Signed)
Addended by: Lendon Ka on: 03/28/2020 05:00 PM   Modules accepted: Orders

## 2020-03-28 NOTE — Telephone Encounter (Signed)
Pt needs refill on b blocker

## 2020-03-31 DIAGNOSIS — Z1231 Encounter for screening mammogram for malignant neoplasm of breast: Secondary | ICD-10-CM | POA: Diagnosis not present

## 2020-03-31 DIAGNOSIS — Z01419 Encounter for gynecological examination (general) (routine) without abnormal findings: Secondary | ICD-10-CM | POA: Diagnosis not present

## 2020-05-19 ENCOUNTER — Telehealth: Payer: Self-pay | Admitting: *Deleted

## 2020-05-19 NOTE — Telephone Encounter (Signed)
Message from Dr. Tenny Craw that patient needs refills on medications.  Had office visit w PCP and labs recently.  Dr. Tenny Craw is requesting notes from ov and labs to review before refilling medications.   I called Dr. Kevan Ny office and left VM on medical records to fax information to [phone].

## 2020-11-06 DIAGNOSIS — E559 Vitamin D deficiency, unspecified: Secondary | ICD-10-CM | POA: Diagnosis not present

## 2020-11-06 DIAGNOSIS — I10 Essential (primary) hypertension: Secondary | ICD-10-CM | POA: Diagnosis not present

## 2020-11-06 DIAGNOSIS — Z13220 Encounter for screening for lipoid disorders: Secondary | ICD-10-CM | POA: Diagnosis not present

## 2020-11-06 DIAGNOSIS — Z0000 Encounter for general adult medical examination without abnormal findings: Secondary | ICD-10-CM | POA: Diagnosis not present

## 2020-11-10 ENCOUNTER — Other Ambulatory Visit: Payer: Self-pay

## 2020-11-10 ENCOUNTER — Ambulatory Visit (INDEPENDENT_AMBULATORY_CARE_PROVIDER_SITE_OTHER): Payer: BLUE CROSS/BLUE SHIELD | Admitting: Internal Medicine

## 2020-11-10 ENCOUNTER — Encounter: Payer: Self-pay | Admitting: Internal Medicine

## 2020-11-10 VITALS — BP 110/70 | HR 68 | Ht 67.0 in | Wt 125.6 lb

## 2020-11-10 DIAGNOSIS — R002 Palpitations: Secondary | ICD-10-CM

## 2020-11-10 DIAGNOSIS — I10 Essential (primary) hypertension: Secondary | ICD-10-CM | POA: Diagnosis not present

## 2020-11-10 NOTE — Progress Notes (Signed)
Cardiology Office Note   Date:  11/10/2020   ID:  Katie Joseph, DOB 1961/04/02, MRN [id number]  PCP:  Marden Noble, MD  Cardiologist:   Dietrich Pates, MD   [name] is a 59 yo who presents for f/u of HTN   History of Present Illness: Katie Joseph is a 59 y.o. female with a history of palpitations and HTN   I saw her in 2017   She had an echo done that was normal   Holter monitor showed no signif arrhythmias, occasional PVCs  The pt has done well on metoprolol  I last saw her in clinic in 2019  Since seen she has done well  Breathing is OK   She denies CP   She is very active, walking over 3 miles per day.    She is concerned because recent lipids showed: 2020 values:Total chol 213   HDL 87  LDL 119  Trig 40 2021:  Total 225   HDL 83  LDL 131  Trig         Current Meds  Medication Sig  . acetaminophen (TYLENOL) 325 MG tablet Take 650 mg by mouth daily as needed for moderate pain.  Marland Kitchen ibuprofen (ADVIL,MOTRIN) 200 MG tablet Take 600 mg by mouth daily as needed for moderate pain.  . metoprolol tartrate (LOPRESSOR) 25 MG tablet Take 1 tablet (25mg ) twice a day and an extra one tablet (25mg ) as needed for palpitations.     Allergies:   Penicillins, Amoxicillin, Chocolate, and Other   Past Medical History:  Diagnosis Date  . Diastolic dysfunction without heart failure   . Diverticulosis   . High blood pressure   . History of malignant melanoma   . Irregular heartbeat   . Personal history of colonic polyps   . Rhinitis, allergic   . Strep throat 02/2008  . Vitamin D deficiency     Past Surgical History:  Procedure Laterality Date  . ABLATION    . DERMOID CYST  EXCISION Right 04/2000   right ovarian cyst  . DILATION AND CURETTAGE, DIAGNOSTIC / THERAPEUTIC  04/13/2012  . MANDIBLE SURGERY  12/1985   [facility], [place], [facility]  . NEVUS EXCISION  03/2008     Social History:  The patient  reports that she has never smoked. She has never used smokeless tobacco.  She reports current alcohol use. She reports that she does not use drugs.   Family History:  The patient's family history includes Breast cancer in her mother; Colon polyps in her mother; Diabetes in her father; Healthy in her daughter; Hypertension in her father and mother; Melanoma in her brother; Obesity in her mother.    ROS:  Please see the history of present illness. All other systems are reviewed and  Negative to the above problem except as noted.    PHYSICAL EXAM: VS:  BP 110/70   Pulse 68   Ht 5\' 7"  (1.702 m)   Wt 125 lb 9.6 oz (57 kg)   SpO2 98%   BMI 19.67 kg/m   GEN: Well nourished, well developed, in no acute distress  HEENT: normal  Neck: no JVD, carotid bruits Cardiac: RRR; no murmurs.  No LE edema  Respiratory:  CTA GI: soft, nontender, nondistended, + BS  No hepatomegaly  MS: no deformity Moving all extremities   Skin: warm and dry, no rash Neuro:  Strength and sensation are intact Psych: euthymic mood, full affect   EKG:  EKG is ordered today.  SR 68 bpm   Nonspecific ST changes   Lipid Panel    Component Value Date/Time   CHOL 180 01/30/2018 1111   TRIG 57 01/30/2018 1111   HDL 78 01/30/2018 1111   CHOLHDL 2.3 01/30/2018 1111   LDLCALC 91 01/30/2018 1111      Wt Readings from Last 3 Encounters:  11/10/20 125 lb 9.6 oz (57 kg)  01/30/18 126 lb 3.2 oz (57.2 kg)      ASSESSMENT AND PLAN:  1  HTN  Good control  Continue    2  Hx palpitations   Pt denies Keep on meds  3  Lipids   Lipids arent too bad   Goal will depend on if she has evid of plaquing   Will set up for Ca score CT   Allso check lipomed panel to further risk stratify       Tentative f/u in 1 year     Current medicines are reviewed at length with the patient today.  The patient does not have concerns regarding medicines.  Signed, Dietrich Pates, MD  11/10/2020 2:55 PM    [facility] [address] [place], [place], [place]  [address] Phone: [phone]; Fax:  [phone]

## 2020-11-10 NOTE — Patient Instructions (Signed)
Medication Instructions:  No changes *If you need a refill on your cardiac medications before your next appointment, please call your pharmacy*   Lab Work: Please return for fasting NMR profile  If you have labs (blood work) drawn today and your tests are completely normal, you will receive your results only by: Marland Kitchen MyChart Message (if you have MyChart) OR . A paper copy in the mail If you have any lab test that is abnormal or we need to change your treatment, we will call you to review the results.   Testing/Procedures: Calcium score ct scan   Follow-Up:  Other Instructions

## 2020-11-18 ENCOUNTER — Other Ambulatory Visit: Payer: BLUE CROSS/BLUE SHIELD

## 2020-11-18 ENCOUNTER — Other Ambulatory Visit: Payer: Self-pay

## 2020-11-18 ENCOUNTER — Ambulatory Visit (INDEPENDENT_AMBULATORY_CARE_PROVIDER_SITE_OTHER)
Admission: RE | Admit: 2020-11-18 | Discharge: 2020-11-18 | Disposition: A | Discharge status: Home / Self Care | Payer: Self-pay | Source: Ambulatory Visit | Attending: Internal Medicine | Admitting: Internal Medicine

## 2020-11-18 DIAGNOSIS — R002 Palpitations: Secondary | ICD-10-CM

## 2020-11-18 DIAGNOSIS — I10 Essential (primary) hypertension: Secondary | ICD-10-CM | POA: Diagnosis not present

## 2020-11-24 LAB — NMR, LIPOPROFILE

## 2020-11-24 LAB — APOLIPOPROTEIN B: Apolipoprotein B: 87 mg/dL (ref <90)

## 2020-11-24 LAB — LIPOPROTEIN A (LPA): Lipoprotein (a): 32.4 nmol/L (ref <75.0)

## 2020-11-25 ENCOUNTER — Telehealth: Payer: Self-pay | Admitting: *Deleted

## 2020-11-25 DIAGNOSIS — R002 Palpitations: Secondary | ICD-10-CM

## 2020-11-25 DIAGNOSIS — I10 Essential (primary) hypertension: Secondary | ICD-10-CM

## 2020-11-25 NOTE — Telephone Encounter (Signed)
-----   Message from Pricilla Riffle, MD sent at 11/24/2020  3:38 PM EST ----- Spoke with pt earlier today apologizing for mixup Needs rest of lab work that was ordered to be done (no charge) She is in [place] now   Would like to have it done at at lab  there.  Can this be organized?

## 2020-11-25 NOTE — Telephone Encounter (Signed)
Per lab tech, pt is out of town in the zip code of [address], [place], [place].  There are no [facility] sites in that location or nearby.  There is an Urgent Care ([facility]).  I spoke with their lab person as it is a [employer] inside the urgent care.  They only do limited testing and do not have the ability to check a NMR Lipomed panel w Lipids.

## 2020-11-26 NOTE — Telephone Encounter (Signed)
Per Dr. Tenny Craw the patient will return to our office lab on Mon Jan 11 for Lipomed Panel.  I will place new orders and schedule her appointment.

## 2020-12-02 NOTE — Telephone Encounter (Signed)
Lab orders for NMR Lipoprofile, Lipoprotein A, and Apolipoprotein B placed for the pt to come into the office and have drawn on 12/16/20, as indicated in this note.  Note placed in lab appt notes stating to ask Michalene RN if pt is to be charged for these labs, for in this note Dr. Tenny Craw specified "no charge" for these labs.  This will need clarification by [facility] RN, who works with Dr. Tenny Craw.

## 2020-12-16 ENCOUNTER — Other Ambulatory Visit: Payer: Self-pay

## 2020-12-16 ENCOUNTER — Other Ambulatory Visit: Payer: BLUE CROSS/BLUE SHIELD | Admitting: *Deleted

## 2020-12-16 DIAGNOSIS — I10 Essential (primary) hypertension: Secondary | ICD-10-CM | POA: Diagnosis not present

## 2020-12-16 DIAGNOSIS — R002 Palpitations: Secondary | ICD-10-CM

## 2020-12-17 LAB — NMR, LIPOPROFILE
Cholesterol, Total: 214 mg/dL — ABNORMAL HIGH (ref 100–199)
HDL Particle Number: 43.1 umol/L (ref >=30.5)
HDL-C: 64 mg/dL (ref >39)
LDL Particle Number: 1270 nmol/L — ABNORMAL HIGH (ref <1000)
LDL Size: 21.3 nm (ref >20.5)
LDL-C (NIH Calc): 136 mg/dL — ABNORMAL HIGH (ref 0–99)
LP-IR Score: 25 (ref <=45)
Small LDL Particle Number: 546 nmol/L — ABNORMAL HIGH (ref <=527)
Triglycerides: 78 mg/dL (ref 0–149)

## 2020-12-17 LAB — APOLIPOPROTEIN B: Apolipoprotein B: 97 mg/dL — ABNORMAL HIGH (ref <90)

## 2020-12-17 LAB — LIPOPROTEIN A (LPA): Lipoprotein (a): 44.6 nmol/L (ref <75.0)

## 2020-12-26 DIAGNOSIS — M2042 Other hammer toe(s) (acquired), left foot: Secondary | ICD-10-CM | POA: Diagnosis not present

## 2020-12-26 DIAGNOSIS — M21612 Bunion of left foot: Secondary | ICD-10-CM | POA: Diagnosis not present

## 2021-03-19 DIAGNOSIS — D2262 Melanocytic nevi of left upper limb, including shoulder: Secondary | ICD-10-CM | POA: Diagnosis not present

## 2021-03-19 DIAGNOSIS — D2261 Melanocytic nevi of right upper limb, including shoulder: Secondary | ICD-10-CM | POA: Diagnosis not present

## 2021-03-19 DIAGNOSIS — D2222 Melanocytic nevi of left ear and external auricular canal: Secondary | ICD-10-CM | POA: Diagnosis not present

## 2021-03-19 DIAGNOSIS — D1801 Hemangioma of skin and subcutaneous tissue: Secondary | ICD-10-CM | POA: Diagnosis not present

## 2021-04-17 DIAGNOSIS — Z1231 Encounter for screening mammogram for malignant neoplasm of breast: Secondary | ICD-10-CM | POA: Diagnosis not present

## 2021-04-17 DIAGNOSIS — Z01419 Encounter for gynecological examination (general) (routine) without abnormal findings: Secondary | ICD-10-CM | POA: Diagnosis not present

## 2021-07-28 DIAGNOSIS — Z0184 Encounter for antibody response examination: Secondary | ICD-10-CM | POA: Diagnosis not present

## 2021-08-06 ENCOUNTER — Telehealth: Payer: Self-pay | Admitting: Internal Medicine

## 2021-08-07 NOTE — Telephone Encounter (Signed)
Error

## 2021-10-27 DIAGNOSIS — Z7189 Other specified counseling: Secondary | ICD-10-CM | POA: Diagnosis not present

## 2021-11-02 ENCOUNTER — Other Ambulatory Visit: Payer: Self-pay

## 2021-11-02 ENCOUNTER — Encounter: Payer: Self-pay | Admitting: Internal Medicine

## 2021-11-02 ENCOUNTER — Ambulatory Visit (INDEPENDENT_AMBULATORY_CARE_PROVIDER_SITE_OTHER): Payer: BC Managed Care – PPO | Admitting: Internal Medicine

## 2021-11-02 VITALS — BP 134/64 | HR 74 | Ht 67.0 in | Wt 121.0 lb

## 2021-11-02 DIAGNOSIS — R002 Palpitations: Secondary | ICD-10-CM

## 2021-11-02 DIAGNOSIS — Z79899 Other long term (current) drug therapy: Secondary | ICD-10-CM | POA: Diagnosis not present

## 2021-11-02 DIAGNOSIS — I10 Essential (primary) hypertension: Secondary | ICD-10-CM | POA: Diagnosis not present

## 2021-11-02 MED ORDER — AMLODIPINE BESYLATE 2.5 MG PO TABS: 2.5000 mg | ORAL_TABLET | Freq: Every day | ORAL | 3 refills | Status: DC

## 2021-11-02 NOTE — Patient Instructions (Signed)
Medication Instructions:  Your physician recommends that you continue on your current medications as directed. Please refer to the Current Medication list given to you today.  *If you need a refill on your cardiac medications before your next appointment, please call your pharmacy*   Lab Work: Bmet, cbc, uric acid, NMR lipomed only, tsh  If you have labs (blood work) drawn today and your tests are completely normal, you will receive your results only by: MyChart Message (if you have MyChart) OR A paper copy in the mail If you have any lab test that is abnormal or we need to change your treatment, we will call you to review the results.   Testing/Procedures: none   Follow-Up: At [facility], you and your health needs are our priority.  As part of our continuing mission to provide you with exceptional heart care, we have created designated Provider Care Teams.  These Care Teams include your primary Cardiologist (physician) and Advanced Practice Providers (APPs -  Physician Assistants and Nurse Practitioners) who all work together to provide you with the care you need, when you need it.  We recommend signing up for the patient portal called "MyChart".  Sign up information is provided on this After Visit Summary.  MyChart is used to connect with patients for Virtual Visits (Telemedicine).  Patients are able to view lab/test results, encounter notes, upcoming appointments, etc.  Non-urgent messages can be sent to your provider as well.   To learn more about what you can do with MyChart, go to [email or web address].

## 2021-11-02 NOTE — Progress Notes (Signed)
Cardiology Office Note   Date:  11/02/2021   ID:  Katie Joseph, DOB [birth year], MRN [id number]  PCP:  Marden Noble, MD  Cardiologist:   Dietrich Pates, MD   [name] is a 60 yo who presents for f/u of HTN   History of Present Illness: Katie Joseph is a 60 y.o. female with a history of palpitations and HTN   I saw her in 2017   She had an echo done that was normal   Holter monitor showed no signif arrhythmias, occasional PVCs  The pt has done well on metoprolol    I last saw the pt in clinic in Dec 2021  She had a calcium score CT after that showed  Ca score of 0      Since seen she remains active   She denies palpitatoins   She does not take her BP.   She is active   Walks 3 to 5 miles per day  A few days ago, after T giving she was on walk   Talking about work   Developed severe chest tightness    Pressure   R parasternal   had to sit down on grass   Episode eased in about 5 min Had in past but this was more severe ? If her BP was up [facility] home   None since   Since then her breathing has also been OK   Denies GI symptoms    Considering new job   Admits to increased stress       Current Meds  Medication Sig   acetaminophen (TYLENOL) 325 MG tablet Take 650 mg by mouth daily as needed for moderate pain.   ibuprofen (ADVIL,MOTRIN) 200 MG tablet Take 600 mg by mouth daily as needed for moderate pain.   metoprolol tartrate (LOPRESSOR) 25 MG tablet Take 1 tablet (25mg ) twice a day and an extra one tablet (25mg ) as needed for palpitations.   rosuvastatin (CRESTOR) 5 MG tablet Take 5 mg by mouth daily.     Allergies:   Penicillins, Amoxicillin, Valsartan, Chocolate, Cocoa, and Other   Past Medical History:  Diagnosis Date   Diastolic dysfunction without heart failure    Diverticulosis    High blood pressure    History of malignant melanoma    Irregular heartbeat    Personal history of colonic polyps    Rhinitis, allergic    Strep throat 02/2008   Vitamin D deficiency      Past Surgical History:  Procedure Laterality Date   ABLATION     DERMOID CYST  EXCISION Right 04/2000   right ovarian cyst   DILATION AND CURETTAGE, DIAGNOSTIC / THERAPEUTIC  04/13/2012   MANDIBLE SURGERY  12/1985   [facility], [place], [facility]   NEVUS EXCISION  03/2008     Social History:  The patient  reports that she has never smoked. She has never used smokeless tobacco. She reports current alcohol use. She reports that she does not use drugs.   Family History:  The patient's family history includes Breast cancer in her mother; Colon polyps in her mother; Diabetes in her father; Healthy in her daughter; Hypertension in her father and mother; Melanoma in her brother; Obesity in her mother.    ROS:  Please see the history of present illness. All other systems are reviewed and  Negative to the above problem except as noted.    PHYSICAL EXAM: VS:  BP 134/64   Pulse 74   Ht 5'  7" (1.702 m)   Wt 121 lb (54.9 kg)   SpO2 99%   BMI 18.95 kg/m   GEN: Well nourished, well developed, in no acute distress  HEENT: normal  Neck: no JVD,  no carotid bruits Cardiac: RRR; no murmurs.  No LE edema  Respiratory:  CTA GI: soft, nontender, nondistended, + BS   MS: no deformity Moving all extremities   Skin: warm and dry, no rash Neuro:  Strength and sensation are intact Psych: euthymic mood, full affect   EKG:  EKG is not  ordered today.   Lipid Panel    Component Value Date/Time   CHOL 180 01/30/2018 1111   TRIG 57 01/30/2018 1111   HDL 78 01/30/2018 1111   CHOLHDL 2.3 01/30/2018 1111   LDLCALC 91 01/30/2018 1111      Wt Readings from Last 3 Encounters:  11/02/21 121 lb (54.9 kg)  11/10/20 125 lb 9.6 oz (57 kg)  01/30/18 126 lb 3.2 oz (57.2 kg)      ASSESSMENT AND PLAN:  1  Chest pain   Has had occasional mild spells in past   The one several days ago was much more intense   Eased on own     None since despite activity     No SOB since  Not clear what spell was  due to   Would add a very low dose of amlodipine 1.25 to regimen   For BP/possible spasm She does admit to increased stress thinking about new job options   This did not help   2  HTN  BP 134/ today   I have asked her to take more often at home.   Will also add low dose amlodipine   Follow   3  Hx palpitations   Denies     4  HL   On Crestor    Check today     Chesk:  lipomed, uric acid, CBC, BMET and TSH     Tentative f/u in 1 year     Current medicines are reviewed at length with the patient today.  The patient does not have concerns regarding medicines.  Signed, Dietrich Pates, MD  11/02/2021 10:39 AM    [facility] [address] [place], [place], [place]  [address] Phone: [phone]; Fax: [phone]

## 2021-11-03 LAB — CBC
Hematocrit: 35.9 % (ref 34.0–46.6)
Hemoglobin: 11.8 g/dL (ref 11.1–15.9)
MCH: 31.1 pg (ref 26.6–33.0)
MCHC: 32.9 g/dL (ref 31.5–35.7)
MCV: 95 fL (ref 79–97)
Platelets: 203 10*3/uL (ref 150–450)
RBC: 3.8 x10E6/uL (ref 3.77–5.28)
RDW: 13.1 % (ref 11.7–15.4)
WBC: 4.7 10*3/uL (ref 3.4–10.8)

## 2021-11-03 LAB — NMR, LIPOPROFILE
Cholesterol, Total: 163 mg/dL (ref 100–199)
HDL Particle Number: 51.3 umol/L (ref >=30.5)
HDL-C: 82 mg/dL (ref >39)
LDL Particle Number: 626 nmol/L (ref <1000)
LDL Size: 20.1 nm — ABNORMAL LOW (ref >20.5)
LDL-C (NIH Calc): 70 mg/dL (ref 0–99)
LP-IR Score: 35 (ref <=45)
Small LDL Particle Number: 303 nmol/L (ref <=527)
Triglycerides: 54 mg/dL (ref 0–149)

## 2021-11-03 LAB — TSH: TSH: 2.06 u[IU]/mL (ref 0.450–4.500)

## 2021-11-03 LAB — BASIC METABOLIC PANEL
BUN/Creatinine Ratio: 21 (ref 12–28)
BUN: 18 mg/dL (ref 8–27)
CO2: 26 mmol/L (ref 20–29)
Calcium: 9.6 mg/dL (ref 8.7–10.3)
Chloride: 105 mmol/L (ref 96–106)
Creatinine, Ser: 0.87 mg/dL (ref 0.57–1.00)
Glucose: 95 mg/dL (ref 70–99)
Potassium: 4.7 mmol/L (ref 3.5–5.2)
Sodium: 143 mmol/L (ref 134–144)
eGFR: 76 mL/min/{1.73_m2} (ref >59)

## 2021-11-03 LAB — URIC ACID: Uric Acid: 4.5 mg/dL (ref 3.0–7.2)

## 2021-12-03 IMAGING — CT CT CARDIAC CORONARY ARTERY CALCIUM SCORE
3 series · 14 of 20 positions shown, 15 images · non-contrast
Comparison: None.
COMPARISON: None.

Addendum:
EXAM:
OVER-READ INTERPRETATION  CT CHEST

The following report is an over-read performed by radiologist Dr.
Kaki Jim [REDACTED] on 11/18/2020. This
over-read does not include interpretation of cardiac or coronary
anatomy or pathology. The coronary calcium score interpretation by
the cardiologist is attached.
CLINICAL DATA: Risk stratification
Coronary Calcium Score
TECHNIQUE: The patient was scanned on a Siemens Force scanner. Axial
non-contrast 3 mm slices were carried out through the heart. The
data set was analyzed on a dedicated work station and scored using
the Agatson method.

[Series 2: casc 3.0 bv41 2 bestdiast 73 % · axial · 0.32mm/px · z∈[-214,-127]mm · 4 of 49 slices shown, 5 images]
[im 10/49  vessel]
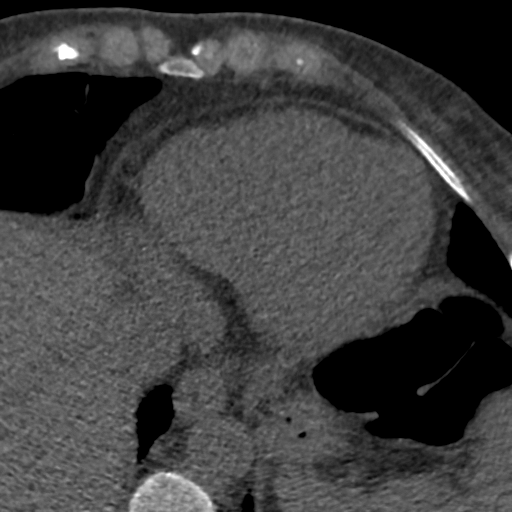
[im 10/49  lung]
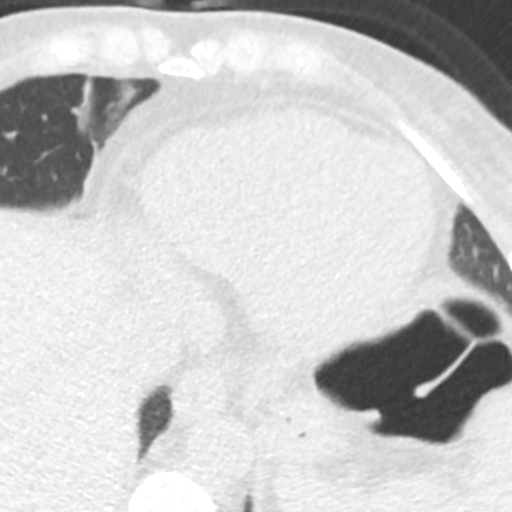
[im 20/49  vessel]
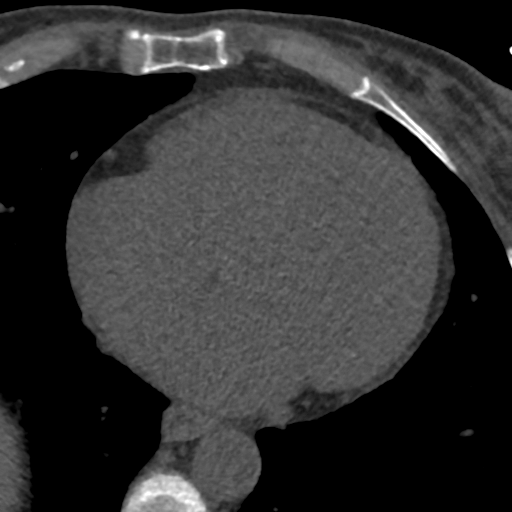
[im 29/49  vessel]
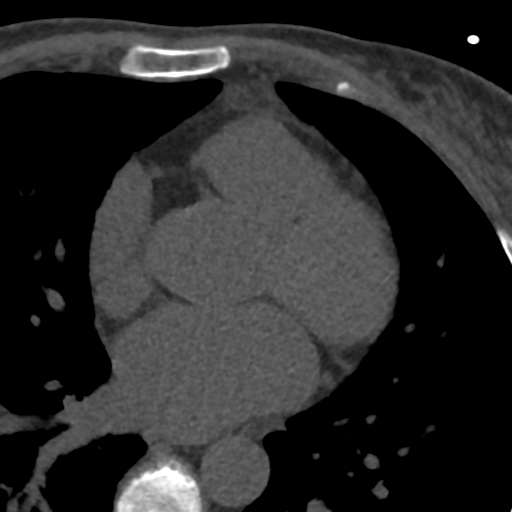
[im 39/49  vessel]
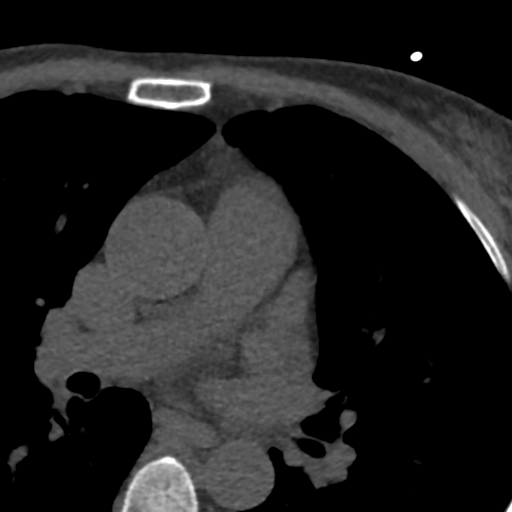

[Series 3: lung 74 % · axial · 0.64mm/px · z∈[-217,-121]mm · 5 of 49 slices shown]
[im 9/49  lung]
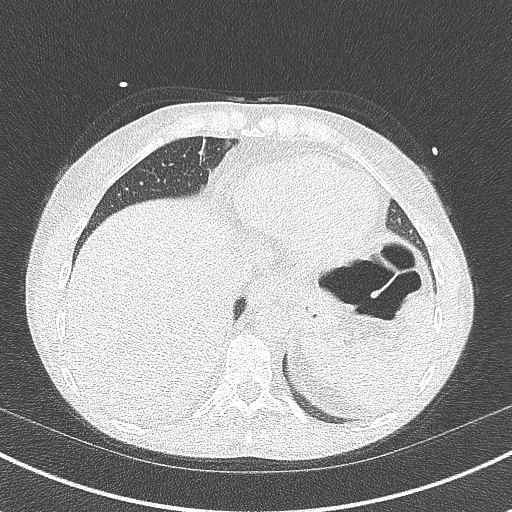
[im 17/49  lung]
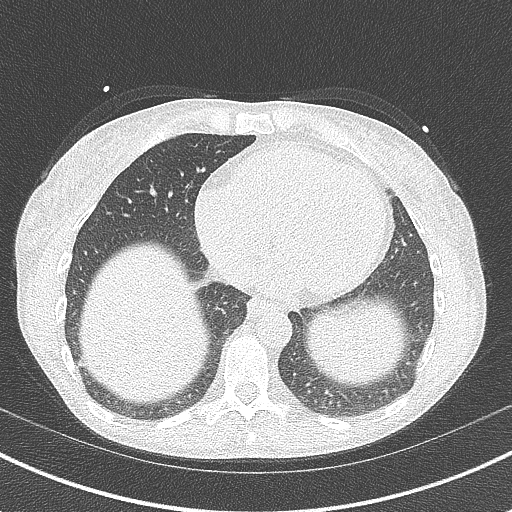
[im 25/49  lung]
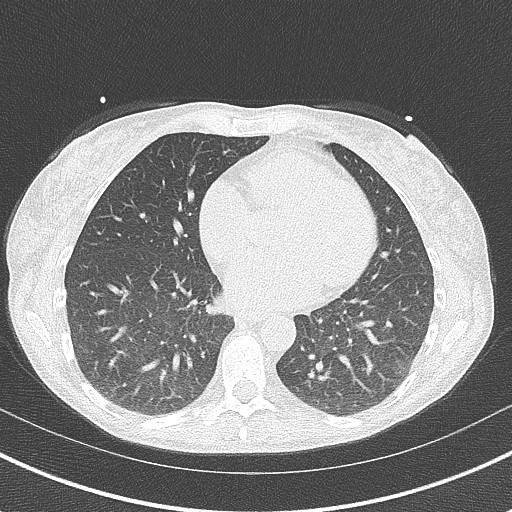
[im 33/49  lung]
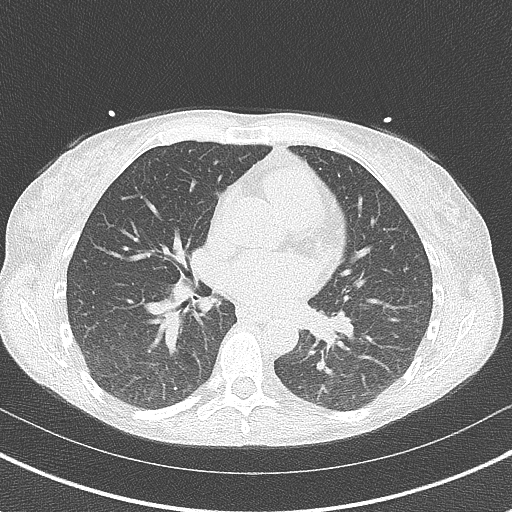
[im 41/49  lung]
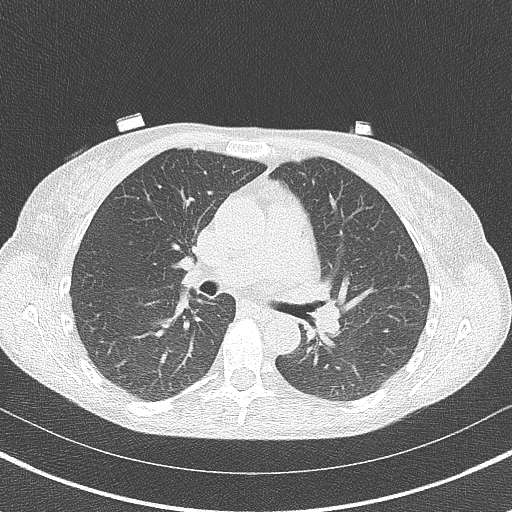

[Series 4: lung st 74 % · axial · 0.64mm/px · z∈[-217,-121]mm · 5 of 49 slices shown]
[im 9/49  lung]
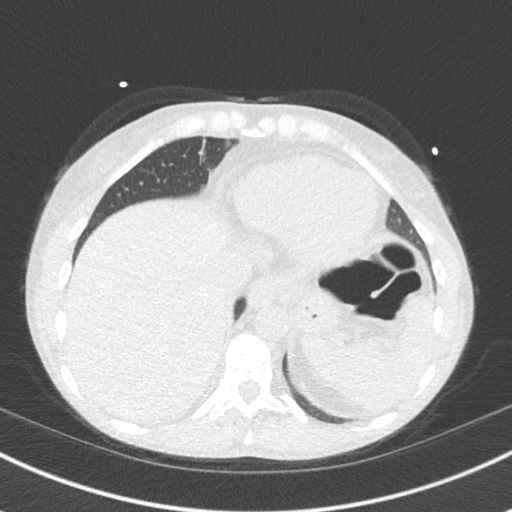
[im 17/49  lung]
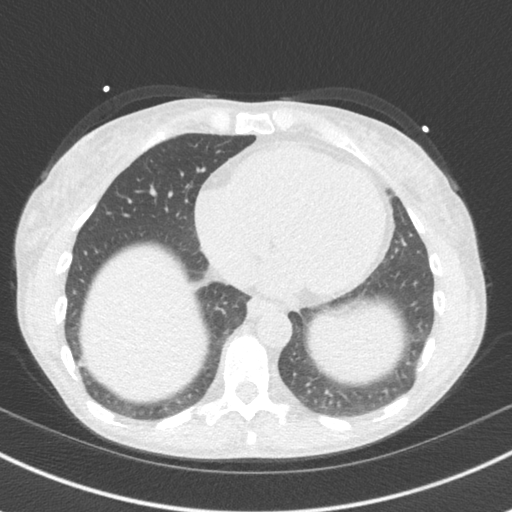
[im 25/49  lung]
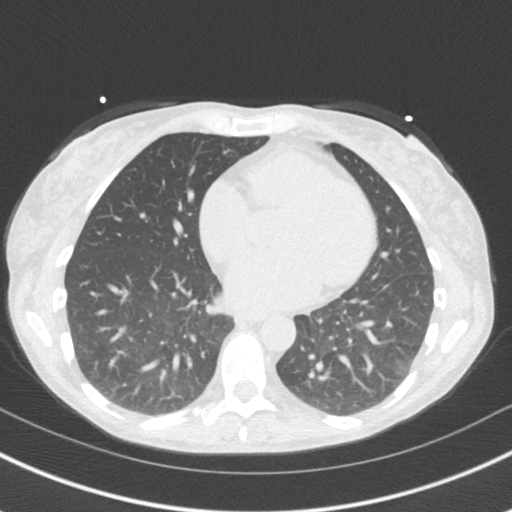
[im 33/49  lung]
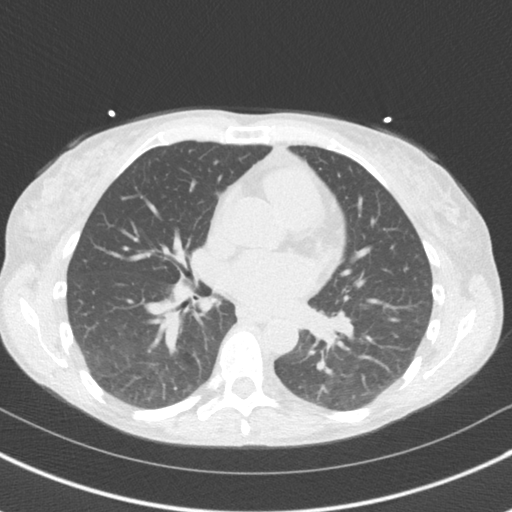
[im 41/49  lung]
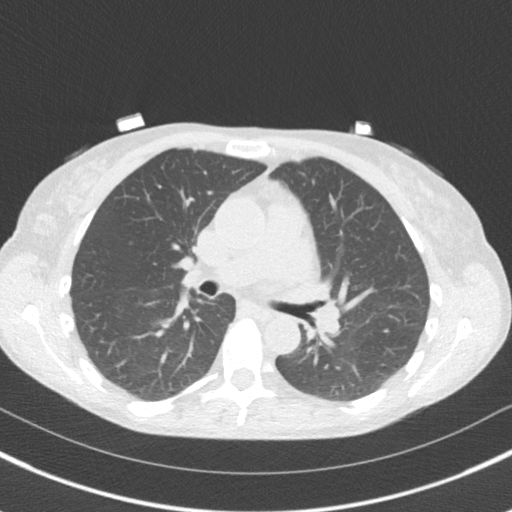

[14 of 20 positions shown; findings below may reference images not displayed]

FINDINGS: Vascular: No significant vascular findings. Normal heart size. No
pericardial effusion.

Mediastinum/Nodes: Visualized mediastinum and hilar regions
demonstrate no lymphadenopathy or masses.

Lungs/Pleura: Visualized lungs show no evidence of pulmonary edema,
consolidation, pneumothorax, nodule or pleural fluid.

Upper Abdomen: No acute abnormality.

Musculoskeletal: No chest wall mass or suspicious bone lesions
identified.
IMPRESSION: No significant incidental findings.
FINDINGS: Non-cardiac: See separate report from [REDACTED].

Ascending Aorta: Normal Caliber.  No calcifications.

Pericardium: Normal

Coronary arteries: Normal coronary origins.  No calcifications.
IMPRESSION: Coronary calcium score of 0. This was 0 percentile for age and sex
matched control.

Marshae Dough

*** End of Addendum ***
EXAM:
OVER-READ INTERPRETATION  CT CHEST

The following report is an over-read performed by radiologist Dr.
Kaki Jim [REDACTED] on 11/18/2020. This
over-read does not include interpretation of cardiac or coronary
anatomy or pathology. The coronary calcium score interpretation by
the cardiologist is attached.
FINDINGS: Vascular: No significant vascular findings. Normal heart size. No
pericardial effusion.

Mediastinum/Nodes: Visualized mediastinum and hilar regions
demonstrate no lymphadenopathy or masses.

Lungs/Pleura: Visualized lungs show no evidence of pulmonary edema,
consolidation, pneumothorax, nodule or pleural fluid.

Upper Abdomen: No acute abnormality.

Musculoskeletal: No chest wall mass or suspicious bone lesions
identified.
IMPRESSION: No significant incidental findings.

## 2022-01-14 ENCOUNTER — Encounter: Payer: Self-pay | Admitting: Internal Medicine

## 2022-01-14 DIAGNOSIS — D225 Melanocytic nevi of trunk: Secondary | ICD-10-CM | POA: Diagnosis not present

## 2022-01-14 DIAGNOSIS — D2262 Melanocytic nevi of left upper limb, including shoulder: Secondary | ICD-10-CM | POA: Diagnosis not present

## 2022-01-14 DIAGNOSIS — D485 Neoplasm of uncertain behavior of skin: Secondary | ICD-10-CM | POA: Diagnosis not present

## 2022-01-14 DIAGNOSIS — D1801 Hemangioma of skin and subcutaneous tissue: Secondary | ICD-10-CM | POA: Diagnosis not present

## 2022-01-14 DIAGNOSIS — D2261 Melanocytic nevi of right upper limb, including shoulder: Secondary | ICD-10-CM | POA: Diagnosis not present

## 2022-04-19 DIAGNOSIS — Z1231 Encounter for screening mammogram for malignant neoplasm of breast: Secondary | ICD-10-CM | POA: Diagnosis not present

## 2022-04-19 DIAGNOSIS — Z780 Asymptomatic menopausal state: Secondary | ICD-10-CM | POA: Diagnosis not present

## 2022-04-19 DIAGNOSIS — Z01419 Encounter for gynecological examination (general) (routine) without abnormal findings: Secondary | ICD-10-CM | POA: Diagnosis not present

## 2022-04-19 DIAGNOSIS — R928 Other abnormal and inconclusive findings on diagnostic imaging of breast: Secondary | ICD-10-CM | POA: Diagnosis not present

## 2022-04-28 ENCOUNTER — Other Ambulatory Visit: Payer: Self-pay | Admitting: Obstetrics & Gynecology

## 2022-04-28 DIAGNOSIS — R928 Other abnormal and inconclusive findings on diagnostic imaging of breast: Secondary | ICD-10-CM

## 2022-05-06 ENCOUNTER — Ambulatory Visit
Admission: RE | Admit: 2022-05-06 | Discharge: 2022-05-06 | Disposition: A | Discharge status: Home / Self Care | Payer: BC Managed Care – PPO | Source: Ambulatory Visit | Attending: Obstetrics & Gynecology | Admitting: Obstetrics & Gynecology

## 2022-05-06 ENCOUNTER — Other Ambulatory Visit: Payer: Self-pay | Admitting: Internal Medicine

## 2022-05-06 DIAGNOSIS — R928 Other abnormal and inconclusive findings on diagnostic imaging of breast: Secondary | ICD-10-CM

## 2022-07-20 DIAGNOSIS — D2261 Melanocytic nevi of right upper limb, including shoulder: Secondary | ICD-10-CM | POA: Diagnosis not present

## 2022-07-20 DIAGNOSIS — D2262 Melanocytic nevi of left upper limb, including shoulder: Secondary | ICD-10-CM | POA: Diagnosis not present

## 2022-07-20 DIAGNOSIS — L738 Other specified follicular disorders: Secondary | ICD-10-CM | POA: Diagnosis not present

## 2022-07-20 DIAGNOSIS — D2271 Melanocytic nevi of right lower limb, including hip: Secondary | ICD-10-CM | POA: Diagnosis not present

## 2022-08-25 ENCOUNTER — Telehealth: Payer: Self-pay

## 2022-08-25 ENCOUNTER — Other Ambulatory Visit: Payer: BC Managed Care – PPO

## 2022-08-25 DIAGNOSIS — Z79899 Other long term (current) drug therapy: Secondary | ICD-10-CM | POA: Diagnosis not present

## 2022-08-25 NOTE — Telephone Encounter (Signed)
Quantiferon-TB lab test ordered per Dr Harrington Challenger.

## 2022-08-30 LAB — QUANTIFERON-TB GOLD PLUS
QuantiFERON Mitogen Value: >10 IU/mL
QuantiFERON Nil Value: 0.03 IU/mL
QuantiFERON TB1 Ag Value: 0.03 IU/mL
QuantiFERON TB2 Ag Value: 0.03 IU/mL
QuantiFERON-TB Gold Plus: NEGATIVE

## 2022-10-11 ENCOUNTER — Telehealth: Payer: Self-pay

## 2022-10-11 NOTE — Telephone Encounter (Signed)
Pt called Dr Harrington Challenger JY:[employer] papers sent to our office but unable to locate will check with the front desk to see if logged in.

## 2022-10-25 ENCOUNTER — Other Ambulatory Visit: Payer: Self-pay | Admitting: Internal Medicine

## 2022-11-17 ENCOUNTER — Other Ambulatory Visit: Payer: Self-pay | Admitting: Internal Medicine

## 2022-11-18 ENCOUNTER — Other Ambulatory Visit: Payer: Self-pay | Admitting: Internal Medicine

## 2022-11-18 DIAGNOSIS — R110 Nausea: Secondary | ICD-10-CM | POA: Diagnosis not present

## 2022-11-18 DIAGNOSIS — A09 Infectious gastroenteritis and colitis, unspecified: Secondary | ICD-10-CM | POA: Diagnosis not present

## 2022-11-18 DIAGNOSIS — E871 Hypo-osmolality and hyponatremia: Secondary | ICD-10-CM | POA: Diagnosis not present

## 2022-11-18 DIAGNOSIS — N1330 Unspecified hydronephrosis: Secondary | ICD-10-CM | POA: Diagnosis not present

## 2022-11-18 DIAGNOSIS — R945 Abnormal results of liver function studies: Secondary | ICD-10-CM | POA: Diagnosis not present

## 2022-11-18 DIAGNOSIS — R160 Hepatomegaly, not elsewhere classified: Secondary | ICD-10-CM | POA: Diagnosis not present

## 2022-11-18 DIAGNOSIS — R7401 Elevation of levels of liver transaminase levels: Secondary | ICD-10-CM | POA: Diagnosis not present

## 2022-11-18 DIAGNOSIS — D649 Anemia, unspecified: Secondary | ICD-10-CM | POA: Diagnosis not present

## 2022-11-18 DIAGNOSIS — D259 Leiomyoma of uterus, unspecified: Secondary | ICD-10-CM | POA: Diagnosis not present

## 2022-11-18 DIAGNOSIS — R197 Diarrhea, unspecified: Secondary | ICD-10-CM | POA: Diagnosis not present

## 2022-11-18 DIAGNOSIS — N281 Cyst of kidney, acquired: Secondary | ICD-10-CM | POA: Diagnosis not present

## 2022-11-18 DIAGNOSIS — R509 Fever, unspecified: Secondary | ICD-10-CM | POA: Diagnosis not present

## 2022-11-18 DIAGNOSIS — Z20822 Contact with and (suspected) exposure to covid-19: Secondary | ICD-10-CM | POA: Diagnosis not present

## 2022-11-22 ENCOUNTER — Other Ambulatory Visit: Payer: Self-pay | Admitting: Internal Medicine

## 2022-12-09 ENCOUNTER — Other Ambulatory Visit: Payer: Self-pay | Admitting: Internal Medicine

## 2022-12-24 ENCOUNTER — Other Ambulatory Visit: Payer: Self-pay | Admitting: Internal Medicine

## 2023-01-06 ENCOUNTER — Ambulatory Visit: Payer: BC Managed Care – PPO | Attending: Internal Medicine | Admitting: Internal Medicine

## 2023-01-06 ENCOUNTER — Encounter: Payer: Self-pay | Admitting: Internal Medicine

## 2023-01-06 VITALS — BP 112/74 | HR 70 | Ht 67.0 in | Wt 119.6 lb

## 2023-01-06 DIAGNOSIS — Z79899 Other long term (current) drug therapy: Secondary | ICD-10-CM | POA: Diagnosis not present

## 2023-01-06 DIAGNOSIS — I10 Essential (primary) hypertension: Secondary | ICD-10-CM | POA: Diagnosis not present

## 2023-01-06 MED ORDER — AMLODIPINE BESYLATE 2.5 MG PO TABS: 2.5000 mg | ORAL_TABLET | Freq: Every day | ORAL | 3 refills | Status: DC

## 2023-01-06 NOTE — Addendum Note (Signed)
Addended by: Stephani Police on: 01/06/2023 07:20 PM   Modules accepted: Orders

## 2023-01-06 NOTE — Progress Notes (Signed)
Cardiology Office Note   Date:  01/06/2023   ID:  Katie Joseph, DOB [birth year], MRN [id number]  PCP:  Josetta Huddle, MD  Cardiologist:   Dorris Carnes, MD   [name] is a 62 yo who presents for f/u of HTN   History of Present Illness: Katie Joseph is a 62 y.o. female with a history of palpitations, HTN and chest pains  Echo in 2017 was normal   Holter monitor showed no signif arrhythmias, occasional PVCs  The pt has done well on metoprolol   Calcium score CT in Dec 2021 was 0  I last saw the pt in Nov 2022    Since seen she remains active   Denies CP   Breathing is good   No signficant palpitations       Walks about 4 miles per day      Current Meds  Medication Sig   acetaminophen (TYLENOL) 325 MG tablet Take 650 mg by mouth daily as needed for moderate pain.   ibuprofen (ADVIL,MOTRIN) 200 MG tablet Take 600 mg by mouth daily as needed for moderate pain.   metoprolol tartrate (LOPRESSOR) 25 MG tablet Take 1 tablet (25mg ) twice a day and an extra one tablet (25mg ) as needed for palpitations.   rosuvastatin (CRESTOR) 5 MG tablet TAKE 1 TABLET BY MOUTH EVERY DAY   [DISCONTINUED] amLODipine (NORVASC) 2.5 MG tablet TAKE 1 TABLET (2.5 MG TOTAL) BY MOUTH DAILY. (TAKE 1/2 TABLET BY MOUTH DAILY)     Allergies:   Penicillins, Chocolate, and Other   Past Medical History:  Diagnosis Date   Diastolic dysfunction without heart failure    Diverticulosis    High blood pressure    History of malignant melanoma    Irregular heartbeat    Personal history of colonic polyps    Rhinitis, allergic    Strep throat 02/2008   Vitamin D deficiency     Past Surgical History:  Procedure Laterality Date   ABLATION     DERMOID CYST  EXCISION Right 04/2000   right ovarian cyst   DILATION AND CURETTAGE, DIAGNOSTIC / THERAPEUTIC  04/13/2012   MANDIBLE SURGERY  12/1985   Katie Joseph, [place]  03/2008     Social History:  The patient  reports that she has never  smoked. She has never used smokeless tobacco. She reports current alcohol use. She reports that she does not use drugs.   Family History:  The patient's family history includes Breast cancer in her mother; Colon polyps in her mother; Diabetes in her father; Healthy in her daughter; Hypertension in her father and mother; Melanoma in her brother; Obesity in her mother.    ROS:  Please see the history of present illness. All other systems are reviewed and  Negative to the above problem except as noted.    PHYSICAL EXAM: VS:  BP 112/74   Pulse 70   Ht 5\' 7"  (1.702 m)   Wt 119 lb 9.6 oz (54.3 kg)   SpO2 99%   BMI 18.73 kg/m   GEN: Well nourished, well developed, in no acute distress  HEENT: normal  Neck: no JVD,  no carotid bruits Cardiac: RRR; no murmurs.  No LE edema  Respiratory:  CTA GI: soft, nontender, nondistended, + BS   MS: no deformity Moving all extremities   Skin: warm and dry, no rash Neuro:  Strength and sensation are intact Psych: euthymic mood, full affect   EKG:  EKG is  NSR 70 bpm   Lipid Panel    Component Value Date/Time   CHOL 180 01/30/2018 1111   TRIG 57 01/30/2018 1111   HDL 78 01/30/2018 1111   CHOLHDL 2.3 01/30/2018 1111   LDLCALC 91 01/30/2018 1111      Wt Readings from Last 3 Encounters:  01/06/23 119 lb 9.6 oz (54.3 kg)  11/02/21 121 lb (54.9 kg)  11/10/20 125 lb 9.6 oz (57 kg)      ASSESSMENT AND PLAN:  1  HTN    BP is well controlled     2  HL   On Crestor   Will repeat lipids      3 Hx of CP   Atypical   One remote spell   Very active    Follow   Will check CBC, CMET, lipomed, VitD, A1C   will also set up for DEXA scan     Tentative f/u in 1 year     Current medicines are reviewed at length with the patient today.  The patient does not have concerns regarding medicines.  Signed, Dorris Carnes, MD  01/06/2023 12:40 PM    [facility] [place], [place], [place]  [address] Phone: [phone]; Fax: [phone]

## 2023-01-06 NOTE — Patient Instructions (Addendum)
Medication Instructions:   *If you need a refill on your cardiac medications before your next appointment, please call your pharmacy*   Lab Work: CBC, CMET, NMR, VIT D, HGBA1C, APO B  If you have labs (blood work) drawn today and your tests are completely normal, you will receive your results only by: [place] (if you have MyChart) OR A paper copy in the mail If you have any lab test that is abnormal or we need to change your treatment, we will call you to review the results.   Testing/Procedures: BONE DENSITY    Follow-Up: At [facility], you and your health needs are our priority.  As part of our continuing mission to provide you with exceptional heart care, we have created designated Provider Care Teams.  These Care Teams include your primary Cardiologist (physician) and Advanced Practice Providers (APPs -  Physician Assistants and Nurse Practitioners) who all work together to provide you with the care you need, when you need it.  We recommend signing up for the patient portal called "MyChart".  Sign up information is provided on this After Visit Summary.  MyChart is used to connect with patients for Virtual Visits (Telemedicine).  Patients are able to view lab/test results, encounter notes, upcoming appointments, etc.  Non-urgent messages can be sent to your provider as well.   To learn more about what you can do with MyChart, go to [email or web address].    Your next appointment:   1 year(s)  Provider:   Dorris Carnes, MD     Other Instructions

## 2023-01-08 LAB — COMPREHENSIVE METABOLIC PANEL
ALT: 29 IU/L (ref 0–32)
AST: 31 IU/L (ref 0–40)
Albumin/Globulin Ratio: 2.1 (ref 1.2–2.2)
Albumin: 5 g/dL — ABNORMAL HIGH (ref 3.9–4.9)
Alkaline Phosphatase: 57 IU/L (ref 44–121)
BUN/Creatinine Ratio: 17 (ref 12–28)
BUN: 15 mg/dL (ref 8–27)
Bilirubin Total: 0.7 mg/dL (ref 0.0–1.2)
CO2: 24 mmol/L (ref 20–29)
Calcium: 10.2 mg/dL (ref 8.7–10.3)
Chloride: 104 mmol/L (ref 96–106)
Creatinine, Ser: 0.9 mg/dL (ref 0.57–1.00)
Globulin, Total: 2.4 g/dL (ref 1.5–4.5)
Glucose: 96 mg/dL (ref 70–99)
Potassium: 5 mmol/L (ref 3.5–5.2)
Sodium: 142 mmol/L (ref 134–144)
Total Protein: 7.4 g/dL (ref 6.0–8.5)
eGFR: 72 mL/min/{1.73_m2} (ref >59)

## 2023-01-08 LAB — NMR, LIPOPROFILE
Cholesterol, Total: 168 mg/dL (ref 100–199)
HDL Particle Number: 43.8 umol/L (ref >=30.5)
HDL-C: 87 mg/dL (ref >39)
LDL Particle Number: 543 nmol/L (ref <1000)
LDL Size: 20.9 nm (ref >20.5)
LDL-C (NIH Calc): 63 mg/dL (ref 0–99)
LP-IR Score: 59 — ABNORMAL HIGH (ref <=45)
Small LDL Particle Number: <90 nmol/L (ref <=527)
Triglycerides: 103 mg/dL (ref 0–149)

## 2023-01-08 LAB — APOLIPOPROTEIN B: Apolipoprotein B: 57 mg/dL (ref <90)

## 2023-01-08 LAB — CBC
Hematocrit: 35.3 % (ref 34.0–46.6)
Hemoglobin: 11.7 g/dL (ref 11.1–15.9)
MCH: 31.2 pg (ref 26.6–33.0)
MCHC: 33.1 g/dL (ref 31.5–35.7)
MCV: 94 fL (ref 79–97)
Platelets: 240 10*3/uL (ref 150–450)
RBC: 3.75 x10E6/uL — ABNORMAL LOW (ref 3.77–5.28)
RDW: 14.5 % (ref 11.7–15.4)
WBC: 3 10*3/uL — ABNORMAL LOW (ref 3.4–10.8)

## 2023-01-08 LAB — HEMOGLOBIN A1C
Est. average glucose Bld gHb Est-mCnc: 108 mg/dL
Hgb A1c MFr Bld: 5.4 % (ref 4.8–5.6)

## 2023-01-08 LAB — VITAMIN D 25 HYDROXY (VIT D DEFICIENCY, FRACTURES): Vit D, 25-Hydroxy: 44.6 ng/mL (ref 30.0–100.0)

## 2023-01-11 DIAGNOSIS — L814 Other melanin hyperpigmentation: Secondary | ICD-10-CM | POA: Diagnosis not present

## 2023-01-11 DIAGNOSIS — L821 Other seborrheic keratosis: Secondary | ICD-10-CM | POA: Diagnosis not present

## 2023-01-11 DIAGNOSIS — Z85820 Personal history of malignant melanoma of skin: Secondary | ICD-10-CM | POA: Diagnosis not present

## 2023-01-11 DIAGNOSIS — D2271 Melanocytic nevi of right lower limb, including hip: Secondary | ICD-10-CM | POA: Diagnosis not present

## 2023-01-13 ENCOUNTER — Other Ambulatory Visit: Payer: Self-pay | Admitting: Internal Medicine

## 2023-01-13 ENCOUNTER — Telehealth: Payer: Self-pay

## 2023-01-13 DIAGNOSIS — Z13820 Encounter for screening for osteoporosis: Secondary | ICD-10-CM

## 2023-01-13 DIAGNOSIS — Z79899 Other long term (current) drug therapy: Secondary | ICD-10-CM

## 2023-01-13 NOTE — Telephone Encounter (Signed)
Order changed for the pts Bone Density to have at the [facility]... pt told the [facility] that she will be making her own appt.   Per Dr Harrington Challenger.. the pt notes the order is not available... Smith Robert [facility] and the order is visible to them and ready to schedule.   Today.. Dr Harrington Challenger says the pt still has not been able to make her appt... I called the pt but unable to leave message since her voicemail has not been set up. Will try her again later today.

## 2023-01-13 NOTE — Telephone Encounter (Signed)
My Chart message sent to the pt since unable to reach her by phone.

## 2023-02-18 ENCOUNTER — Other Ambulatory Visit: Payer: BC Managed Care – PPO

## 2023-03-07 ENCOUNTER — Inpatient Hospital Stay: Admission: RE | Admit: 2023-03-07 | Payer: BC Managed Care – PPO | Source: Ambulatory Visit

## 2023-03-07 ENCOUNTER — Ambulatory Visit
Admission: RE | Admit: 2023-03-07 | Discharge: 2023-03-07 | Disposition: A | Discharge status: Home / Self Care | Payer: BC Managed Care – PPO | Source: Ambulatory Visit | Attending: Internal Medicine | Admitting: Internal Medicine

## 2023-03-07 DIAGNOSIS — Z780 Asymptomatic menopausal state: Secondary | ICD-10-CM | POA: Diagnosis not present

## 2023-03-07 DIAGNOSIS — Z13820 Encounter for screening for osteoporosis: Secondary | ICD-10-CM

## 2023-03-07 DIAGNOSIS — Z79899 Other long term (current) drug therapy: Secondary | ICD-10-CM

## 2023-04-11 ENCOUNTER — Telehealth: Payer: Self-pay | Admitting: Internal Medicine

## 2023-04-11 DIAGNOSIS — Z1239 Encounter for other screening for malignant neoplasm of breast: Secondary | ICD-10-CM

## 2023-04-11 NOTE — Telephone Encounter (Signed)
Spoke to patient   Please put in order for mammogram at the [facility]

## 2023-04-11 NOTE — Telephone Encounter (Signed)
Orders have been placed.

## 2023-04-11 NOTE — Addendum Note (Signed)
Addended by: Frutoso Schatz on: 04/11/2023 04:24 PM   Modules accepted: Orders

## 2023-05-18 ENCOUNTER — Other Ambulatory Visit: Payer: Self-pay

## 2023-05-18 MED ORDER — ROSUVASTATIN CALCIUM 5 MG PO TABS: 5.0000 mg | ORAL_TABLET | Freq: Every day | ORAL | 3 refills | Status: DC

## 2023-05-30 DIAGNOSIS — Z124 Encounter for screening for malignant neoplasm of cervix: Secondary | ICD-10-CM | POA: Diagnosis not present

## 2023-05-30 DIAGNOSIS — Z1231 Encounter for screening mammogram for malignant neoplasm of breast: Secondary | ICD-10-CM | POA: Diagnosis not present

## 2023-05-30 DIAGNOSIS — Z01419 Encounter for gynecological examination (general) (routine) without abnormal findings: Secondary | ICD-10-CM | POA: Diagnosis not present

## 2023-07-11 DIAGNOSIS — L738 Other specified follicular disorders: Secondary | ICD-10-CM | POA: Diagnosis not present

## 2023-07-11 DIAGNOSIS — L858 Other specified epidermal thickening: Secondary | ICD-10-CM | POA: Diagnosis not present

## 2023-07-11 DIAGNOSIS — D2261 Melanocytic nevi of right upper limb, including shoulder: Secondary | ICD-10-CM | POA: Diagnosis not present

## 2023-07-11 DIAGNOSIS — D2239 Melanocytic nevi of other parts of face: Secondary | ICD-10-CM | POA: Diagnosis not present

## 2023-10-05 DIAGNOSIS — K5730 Diverticulosis of large intestine without perforation or abscess without bleeding: Secondary | ICD-10-CM | POA: Diagnosis not present

## 2023-10-05 DIAGNOSIS — Z83719 Family history of colon polyps, unspecified: Secondary | ICD-10-CM | POA: Diagnosis not present

## 2023-10-05 DIAGNOSIS — Z09 Encounter for follow-up examination after completed treatment for conditions other than malignant neoplasm: Secondary | ICD-10-CM | POA: Diagnosis not present

## 2023-10-05 DIAGNOSIS — Z860100 Personal history of colon polyps, unspecified: Secondary | ICD-10-CM | POA: Diagnosis not present

## 2023-10-05 DIAGNOSIS — K635 Polyp of colon: Secondary | ICD-10-CM | POA: Diagnosis not present

## 2024-01-01 ENCOUNTER — Other Ambulatory Visit: Payer: Self-pay | Admitting: Internal Medicine

## 2024-01-13 ENCOUNTER — Telehealth: Payer: Self-pay | Admitting: Internal Medicine

## 2024-01-13 MED ORDER — METOPROLOL TARTRATE 25 MG PO TABS: ORAL_TABLET | ORAL | 3 refills | Status: DC

## 2024-01-13 NOTE — Telephone Encounter (Signed)
 PT called in   Needs refills on metoprolol  Also , we need to find a day when she can come in for follow up appt Please see when she is in town in the next couple months, she can respond by mychart

## 2024-01-13 NOTE — Addendum Note (Signed)
 Addended by: Alanna Alley on: 01/13/2024 04:00 PM   Modules accepted: Orders

## 2024-01-13 NOTE — Telephone Encounter (Signed)
 RX sent to her Pharmacy and will follow up with her to make her an OV with Dr Avanell Bob.

## 2024-01-23 NOTE — Telephone Encounter (Signed)
 Pt added to my list of pts that need to be seen.

## 2024-01-27 ENCOUNTER — Telehealth: Payer: Self-pay

## 2024-01-27 DIAGNOSIS — Z131 Encounter for screening for diabetes mellitus: Secondary | ICD-10-CM

## 2024-01-27 DIAGNOSIS — Z79899 Other long term (current) drug therapy: Secondary | ICD-10-CM

## 2024-01-27 DIAGNOSIS — I10 Essential (primary) hypertension: Secondary | ICD-10-CM

## 2024-01-27 DIAGNOSIS — Z1329 Encounter for screening for other suspected endocrine disorder: Secondary | ICD-10-CM

## 2024-01-27 DIAGNOSIS — Z13220 Encounter for screening for lipoid disorders: Secondary | ICD-10-CM

## 2024-01-27 NOTE — Telephone Encounter (Signed)
-----   Message from Dietrich Pates sent at 01/25/2024  1:33 PM EST ----- Please place patient in to see me on 02/22/24 (Wednesday) at 8 am Place orders in for lipomed panel, A1C, CBC, CMET, Vit D and TSH

## 2024-01-27 NOTE — Telephone Encounter (Signed)
 Labs ordered and released and appt made.

## 2024-02-13 DIAGNOSIS — Z131 Encounter for screening for diabetes mellitus: Secondary | ICD-10-CM | POA: Diagnosis not present

## 2024-02-13 DIAGNOSIS — I10 Essential (primary) hypertension: Secondary | ICD-10-CM | POA: Diagnosis not present

## 2024-02-13 DIAGNOSIS — Z13220 Encounter for screening for lipoid disorders: Secondary | ICD-10-CM | POA: Diagnosis not present

## 2024-02-14 LAB — CBC
Hematocrit: 36.8 % (ref 34.0–46.6)
Hemoglobin: 12.2 g/dL (ref 11.1–15.9)
MCH: 31.5 pg (ref 26.6–33.0)
MCHC: 33.2 g/dL (ref 31.5–35.7)
MCV: 95 fL (ref 79–97)
Platelets: 204 10*3/uL (ref 150–450)
RBC: 3.87 x10E6/uL (ref 3.77–5.28)
RDW: 12.6 % (ref 11.7–15.4)
WBC: 4.1 10*3/uL (ref 3.4–10.8)

## 2024-02-14 LAB — VITAMIN D 25 HYDROXY (VIT D DEFICIENCY, FRACTURES): Vit D, 25-Hydroxy: 71.8 ng/mL (ref 30.0–100.0)

## 2024-02-14 LAB — COMPREHENSIVE METABOLIC PANEL
ALT: 20 IU/L (ref 0–32)
AST: 23 IU/L (ref 0–40)
Albumin: 4.8 g/dL (ref 3.9–4.9)
Alkaline Phosphatase: 54 IU/L (ref 44–121)
BUN/Creatinine Ratio: 18 (ref 12–28)
BUN: 16 mg/dL (ref 8–27)
Bilirubin Total: 1.1 mg/dL (ref 0.0–1.2)
CO2: 26 mmol/L (ref 20–29)
Calcium: 9.8 mg/dL (ref 8.7–10.3)
Chloride: 103 mmol/L (ref 96–106)
Creatinine, Ser: 0.87 mg/dL (ref 0.57–1.00)
Globulin, Total: 2.1 g/dL (ref 1.5–4.5)
Glucose: 96 mg/dL (ref 70–99)
Potassium: 4.6 mmol/L (ref 3.5–5.2)
Sodium: 141 mmol/L (ref 134–144)
Total Protein: 6.9 g/dL (ref 6.0–8.5)
eGFR: 75 mL/min/{1.73_m2} (ref >59)

## 2024-02-14 LAB — HEMOGLOBIN A1C
Est. average glucose Bld gHb Est-mCnc: 114 mg/dL
Hgb A1c MFr Bld: 5.6 % (ref 4.8–5.6)

## 2024-02-14 LAB — TSH: TSH: 2.05 u[IU]/mL (ref 0.450–4.500)

## 2024-02-14 LAB — NMR, LIPOPROFILE
Cholesterol, Total: 149 mg/dL (ref 100–199)
HDL Particle Number: 44.4 umol/L (ref >=30.5)
HDL-C: 76 mg/dL (ref >39)
LDL Particle Number: 404 nmol/L (ref <1000)
LDL Size: 21 nm (ref >20.5)
LDL-C (NIH Calc): 56 mg/dL (ref 0–99)
LP-IR Score: <25 (ref <=45)
Small LDL Particle Number: <90 nmol/L (ref <=527)
Triglycerides: 90 mg/dL (ref 0–149)

## 2024-02-15 ENCOUNTER — Encounter: Payer: Self-pay | Admitting: Internal Medicine

## 2024-02-21 NOTE — Progress Notes (Signed)
 Cardiology Office Note   Date:  02/22/2024   ID:  Katie Joseph, DOB 1961/07/16, MRN [id number]  PCP:  Marden Noble, MD (Inactive)  Cardiologist:   Dietrich Pates, MD   [name] is a 62 yo who presents today  for f/u of HTN   History of Present Illness: Katie Joseph is a 63 y.o. female with a history of palpitations, HTN and chest pains  Echo in 2017 was normal   Holter monitor showed no signif arrhythmias, occasional PVCs  The pt has done well on metoprolol   Calcium score CT in Dec 2021 was 0  I last saw the pt in clinic in Feb 2024   Since seen she remains very active   Continues to walk a few miles several times per week  Breathing is good   No CP   Rare palpitations     Current Meds  Medication Sig   acetaminophen (TYLENOL) 325 MG tablet Take 650 mg by mouth daily as needed for moderate pain.   amLODipine (NORVASC) 2.5 MG tablet TAKE 1 TABLET (2.5 MG TOTAL) BY MOUTH DAILY. (TAKE 1/2 TABLET BY MOUTH DAILY)   ibuprofen (ADVIL,MOTRIN) 200 MG tablet Take 600 mg by mouth daily as needed for moderate pain.   metoprolol tartrate (LOPRESSOR) 25 MG tablet Take 1 tablet (25mg ) twice a day and an extra one tablet (25mg ) as needed for palpitations.   rosuvastatin (CRESTOR) 5 MG tablet Take 1 tablet (5 mg total) by mouth daily.     Allergies:   Penicillins, Chocolate, and Other   Past Medical History:  Diagnosis Date   Diastolic dysfunction without heart failure    Diverticulosis    High blood pressure    History of malignant melanoma    Irregular heartbeat    Personal history of colonic polyps    Rhinitis, allergic    Strep throat 02/2008   Vitamin D deficiency     Past Surgical History:  Procedure Laterality Date   ABLATION     DERMOID CYST  EXCISION Right 04/2000   right ovarian cyst   DILATION AND CURETTAGE, DIAGNOSTIC / THERAPEUTIC  04/13/2012   MANDIBLE SURGERY  12/1985   [facility], [place], [place]   NEVUS EXCISION  03/2008     Social History:  The patient   reports that she has never smoked. She has never used smokeless tobacco. She reports current alcohol use. She reports that she does not use drugs.   Family History:  The patient's family history includes Breast cancer in her mother; Colon polyps in her mother; Diabetes in her father; Healthy in her daughter; Hypertension in her father and mother; Melanoma in her brother; Obesity in her mother.    ROS:  Please see the history of present illness. All other systems are reviewed and  Negative to the above problem except as noted.    PHYSICAL EXAM: VS:  BP 112/60 (BP Location: Left Arm, Cuff Size: Normal)   Pulse 71   Ht 5\' 6"  (1.676 m)   Wt 55.3 kg   SpO2 99%   BMI 19.69 kg/m   GEN: Well nourished, well developed, in no acute distress  HEENT: normal  Neck: JVP is normal   no carotid bruits Cardiac: RRR; no murmurs.  No LE edema  Respiratory:  CTA GI: soft, nontender, no masses  No hepatomegaly MS: no deformity Moving all extremities   Skin: warm and dry, no rash  EKG:  EKG shows NSR   Lipid Panel  Component Value Date/Time   CHOL 180 01/30/2018 1111   TRIG 57 01/30/2018 1111   HDL 78 01/30/2018 1111   CHOLHDL 2.3 01/30/2018 1111   LDLCALC 91 01/30/2018 1111      Wt Readings from Last 3 Encounters:  02/22/24 55.3 kg  01/06/23 54.3 kg  11/02/21 54.9 kg      ASSESSMENT AND PLAN:  1  HTN    BP is well controlled     2  HL   Continue Crestor  Lipids are excellent  LDL 56  Particle number [id number]   HDL 76  Trig 90   Would back down to 2.5 mg    Follow up lipomed later this year       3 Hx of CP   Remote   No recurrence      4  Palpitations   Denies   Tentative f/u in 1 year     Current medicines are reviewed at length with the patient today.  The patient does not have concerns regarding medicines.  Signed, Dietrich Pates, MD  02/22/2024 8:21 AM    [facility] [address] [place], [place], [place]  [address] Phone: [phone]; Fax: [phone]

## 2024-02-22 ENCOUNTER — Ambulatory Visit: Payer: BC Managed Care – PPO | Attending: Internal Medicine | Admitting: Internal Medicine

## 2024-02-22 VITALS — BP 112/60 | HR 71 | Ht 66.0 in | Wt 122.0 lb

## 2024-02-22 DIAGNOSIS — Z79899 Other long term (current) drug therapy: Secondary | ICD-10-CM

## 2024-02-22 DIAGNOSIS — R0602 Shortness of breath: Secondary | ICD-10-CM

## 2024-02-22 NOTE — Patient Instructions (Signed)
 Medication Instructions:   *If you need a refill on your cardiac medications before your next appointment, please call your pharmacy*   Lab Work:  If you have labs (blood work) drawn today and your tests are completely normal, you will receive your results only by: MyChart Message (if you have MyChart) OR A paper copy in the mail If you have any lab test that is abnormal or we need to change your treatment, we will call you to review the results.   Testing/Procedures: Your physician has requested that you have an echocardiogram. Echocardiography is a painless test that uses sound waves to create images of your heart. It provides your doctor with information about the size and shape of your heart and how well your heart's chambers and valves are working. This procedure takes approximately one hour. There are no restrictions for this procedure. Please do NOT wear cologne, perfume, aftershave, or lotions (deodorant is allowed). Please arrive 15 minutes prior to your appointment time.  Please note: We ask at that you not bring children with you during ultrasound (echo/ vascular) testing. Due to room size and safety concerns, children are not allowed in the ultrasound rooms during exams. Our front office staff cannot provide observation of children in our lobby area while testing is being conducted. An adult accompanying a patient to their appointment will only be allowed in the ultrasound room at the discretion of the ultrasound technician under special circumstances. We apologize for any inconvenience.    Follow-Up: At [facility], you and your health needs are our priority.  As part of our continuing mission to provide you with exceptional heart care, we have created designated Provider Care Teams.  These Care Teams include your primary Cardiologist (physician) and Advanced Practice Providers (APPs -  Physician Assistants and Nurse Practitioners) who all work together to provide you  with the care you need, when you need it.  We recommend signing up for the patient portal called "MyChart".  Sign up information is provided on this After Visit Summary.  MyChart is used to connect with patients for Virtual Visits (Telemedicine).  Patients are able to view lab/test results, encounter notes, upcoming appointments, etc.  Non-urgent messages can be sent to your provider as well.   To learn more about what you can do with MyChart, go to [email or web address].

## 2024-03-15 ENCOUNTER — Ambulatory Visit (HOSPITAL_COMMUNITY): Attending: Internal Medicine

## 2024-03-15 DIAGNOSIS — Z79899 Other long term (current) drug therapy: Secondary | ICD-10-CM | POA: Insufficient documentation

## 2024-03-15 DIAGNOSIS — R0602 Shortness of breath: Secondary | ICD-10-CM | POA: Insufficient documentation

## 2024-03-15 LAB — ECHOCARDIOGRAM COMPLETE
Area-P 1/2: 3.36 cm2
S' Lateral: 3.1 cm

## 2024-04-09 DIAGNOSIS — D485 Neoplasm of uncertain behavior of skin: Secondary | ICD-10-CM | POA: Diagnosis not present

## 2024-04-09 DIAGNOSIS — D2271 Melanocytic nevi of right lower limb, including hip: Secondary | ICD-10-CM | POA: Diagnosis not present

## 2024-04-09 DIAGNOSIS — D225 Melanocytic nevi of trunk: Secondary | ICD-10-CM | POA: Diagnosis not present

## 2024-04-09 DIAGNOSIS — L821 Other seborrheic keratosis: Secondary | ICD-10-CM | POA: Diagnosis not present

## 2024-04-09 DIAGNOSIS — L570 Actinic keratosis: Secondary | ICD-10-CM | POA: Diagnosis not present

## 2024-04-23 ENCOUNTER — Other Ambulatory Visit: Payer: Self-pay | Admitting: Internal Medicine

## 2024-05-05 ENCOUNTER — Other Ambulatory Visit: Payer: Self-pay | Admitting: Internal Medicine

## 2024-05-30 DIAGNOSIS — R92343 Mammographic extreme density, bilateral breasts: Secondary | ICD-10-CM | POA: Diagnosis not present

## 2024-05-30 DIAGNOSIS — Z803 Family history of malignant neoplasm of breast: Secondary | ICD-10-CM | POA: Diagnosis not present

## 2024-05-30 DIAGNOSIS — Z01419 Encounter for gynecological examination (general) (routine) without abnormal findings: Secondary | ICD-10-CM | POA: Diagnosis not present

## 2024-05-30 DIAGNOSIS — Z1231 Encounter for screening mammogram for malignant neoplasm of breast: Secondary | ICD-10-CM | POA: Diagnosis not present

## 2024-07-12 ENCOUNTER — Telehealth: Payer: Self-pay | Admitting: Internal Medicine

## 2024-07-12 NOTE — Telephone Encounter (Signed)
 Called in Rx for  Crestor  5 mg  daily Farxiga 5 mg daily  Please put in for BMET in 10 days ([facility])Please put in orders

## 2024-07-18 ENCOUNTER — Ambulatory Visit (INDEPENDENT_AMBULATORY_CARE_PROVIDER_SITE_OTHER): Admitting: Orthopedic Surgery

## 2024-07-18 ENCOUNTER — Encounter: Payer: Self-pay | Admitting: Orthopedic Surgery

## 2024-07-18 ENCOUNTER — Other Ambulatory Visit (INDEPENDENT_AMBULATORY_CARE_PROVIDER_SITE_OTHER): Payer: Self-pay

## 2024-07-18 DIAGNOSIS — M25562 Pain in left knee: Secondary | ICD-10-CM | POA: Diagnosis not present

## 2024-07-18 DIAGNOSIS — M25462 Effusion, left knee: Secondary | ICD-10-CM | POA: Diagnosis not present

## 2024-07-19 ENCOUNTER — Encounter: Payer: Self-pay | Admitting: Orthopedic Surgery

## 2024-07-19 ENCOUNTER — Ambulatory Visit
Admission: RE | Admit: 2024-07-19 | Discharge: 2024-07-19 | Disposition: A | Discharge status: Home / Self Care | Source: Ambulatory Visit | Attending: Orthopedic Surgery | Admitting: Orthopedic Surgery

## 2024-07-19 DIAGNOSIS — M25562 Pain in left knee: Secondary | ICD-10-CM

## 2024-07-19 MED ORDER — LIDOCAINE HCL 1 % IJ SOLN
5.0000 mL | INTRAMUSCULAR | Status: AC | PRN
  Administered 2024-07-18: 5 mL
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
## 2024-07-30 ENCOUNTER — Telehealth: Payer: Self-pay | Admitting: Internal Medicine

## 2024-07-30 DIAGNOSIS — Z021 Encounter for pre-employment examination: Secondary | ICD-10-CM

## 2024-07-30 NOTE — Telephone Encounter (Signed)
 Pt about to take a job  Needs labs before starting:  Mumps Antibodiies, IgG   (903447)  Rubella Antibodies , IgG (993802)  Measles Antibodies, IgG (903439)  HIV (1/0/2) Ag/ Ab (4th gen) with reflex  (916064)  Hep B Core Ab, Tot (993281)  Drug Screen   Tox ASSURE Select 13, urine (261473)  Varicella -Zoster Ab, IgG (903793)   HBsAb (992604)  Quarntiferon TB GOld Plus (817120)  Tetanus/Diphtheria Ab (836746)  Bordetella pertussis IgG Ab (838254)

## 2024-08-01 NOTE — Telephone Encounter (Signed)
 Call from Dr. Okey requesting for orders to be placed for this patient for pre employment labs.  Lab orders placed.  Released.  Dr. Okey aware.

## 2024-08-01 NOTE — Addendum Note (Signed)
 Addended by: Nakoa Ganus on: 08/01/2024 03:04 PM   Modules accepted: Orders

## 2024-08-03 ENCOUNTER — Other Ambulatory Visit: Payer: Self-pay | Admitting: *Deleted

## 2024-08-03 DIAGNOSIS — Z23 Encounter for immunization: Secondary | ICD-10-CM

## 2024-08-03 NOTE — Progress Notes (Signed)
 Per Dr. Okey, patient is at high risk for exposure at work and covid vaccine order is needed for patient to have booster.  Order placed to [facility] per Dr. Okey and patient's request.

## 2024-08-07 LAB — TOXASSURE SELECT 13 (MW), URINE

## 2024-08-09 LAB — MUMPS ANTIBODY, IGG: MUMPS ABS, IGG: 96.3 [AU]/ml (ref >10.9)

## 2024-08-09 LAB — QUANTIFERON-TB GOLD PLUS
QuantiFERON Mitogen Value: >10 [IU]/mL
QuantiFERON Nil Value: 0.07 [IU]/mL
QuantiFERON TB1 Ag Value: 0.08 [IU]/mL
QuantiFERON TB2 Ag Value: 0.07 [IU]/mL

## 2024-08-09 LAB — BORDETELLA PERTUSSIS ANTIBODY
B pertussis IgA Ab, Quant: 1.5 {index} — AB (ref 0.0–0.9)
B pertussis IgG Ab: 3.19 {index} — AB (ref 0.00–0.94)
B pertussis IgM Ab, Quant: <1 {index} (ref 0.0–0.9)

## 2024-08-09 LAB — VARICELLA ZOSTER ANTIBODY, IGG: Varicella zoster IgG: REACTIVE

## 2024-08-09 LAB — HEPATITIS B SURFACE ANTIBODY, QUANTITATIVE: Hepatitis B Surf Ab Quant: 26.7 m[IU]/mL

## 2024-08-09 LAB — DIPHTHERIA / TETANUS ANTIBODY PANEL
Diphtheria Ab: >3 [IU]/mL (ref <0.10)
Tetanus Ab, IgG: 0.77 [IU]/mL (ref <0.10)

## 2024-08-09 LAB — RUBELLA ANTIBODY, IGM: Rubella IgM: <20 [AU]/ml (ref 0.0–19.9)

## 2024-08-09 LAB — HEPATITIS B CORE ANTIBODY, TOTAL: Hep B Core Total Ab: NEGATIVE

## 2024-08-09 LAB — RUBEOLA ANTIBODY IGG: RUBEOLA AB, IGG: 99.7 [AU]/ml (ref >16.4)

## 2024-08-31 ENCOUNTER — Other Ambulatory Visit: Payer: Self-pay | Admitting: Gastroenterology

## 2024-08-31 DIAGNOSIS — R509 Fever, unspecified: Secondary | ICD-10-CM

## 2024-08-31 DIAGNOSIS — R634 Abnormal weight loss: Secondary | ICD-10-CM

## 2024-08-31 DIAGNOSIS — R109 Unspecified abdominal pain: Secondary | ICD-10-CM

## 2024-09-05 ENCOUNTER — Inpatient Hospital Stay
Admission: RE | Admit: 2024-09-05 | Discharge: 2024-09-05 | Disposition: A | Source: Ambulatory Visit | Attending: Gastroenterology | Admitting: Gastroenterology

## 2024-09-05 DIAGNOSIS — R634 Abnormal weight loss: Secondary | ICD-10-CM

## 2024-09-05 DIAGNOSIS — R109 Unspecified abdominal pain: Secondary | ICD-10-CM

## 2024-09-05 DIAGNOSIS — R509 Fever, unspecified: Secondary | ICD-10-CM

## 2024-09-05 MED ORDER — IOPAMIDOL (ISOVUE-300) INJECTION 61%
100.0000 mL | Freq: Once | INTRAVENOUS | Status: AC | PRN
  Administered 2024-09-05: 100 mL via INTRAVENOUS

## 2024-10-08 ENCOUNTER — Encounter: Payer: Self-pay | Admitting: Radiology

## 2024-12-29 ENCOUNTER — Other Ambulatory Visit: Payer: Self-pay | Admitting: Internal Medicine

## 2024-12-31 ENCOUNTER — Other Ambulatory Visit: Payer: Self-pay | Admitting: Nurse Practitioner

## 2024-12-31 DIAGNOSIS — Z021 Encounter for pre-employment examination: Secondary | ICD-10-CM

## 2025-01-01 ENCOUNTER — Other Ambulatory Visit (HOSPITAL_COMMUNITY): Payer: Self-pay

## 2025-01-01 MED ORDER — PRIORIX ~~LOC~~ SUSR
0.5000 mL | Freq: Once | SUBCUTANEOUS | 0 refills | Status: AC
  Filled 2025-01-01: qty 0.5, 1d supply, fill #0

## 2025-01-02 ENCOUNTER — Other Ambulatory Visit (HOSPITAL_COMMUNITY): Payer: Self-pay
